# Patient Record
Sex: Female | Born: 1965 | Hispanic: No | Marital: Single | Smoking: Current every day smoker
Health system: Southern US, Community
[De-identification: ages and names within clinical notes are randomized; demographics above are authoritative.]

## PROBLEM LIST (undated history)

## (undated) DIAGNOSIS — E782 Mixed hyperlipidemia: Secondary | ICD-10-CM

## (undated) DIAGNOSIS — L309 Dermatitis, unspecified: Secondary | ICD-10-CM

## (undated) DIAGNOSIS — F32A Depression, unspecified: Secondary | ICD-10-CM

## (undated) DIAGNOSIS — F419 Anxiety disorder, unspecified: Secondary | ICD-10-CM

## (undated) DIAGNOSIS — M1990 Unspecified osteoarthritis, unspecified site: Secondary | ICD-10-CM

## (undated) HISTORY — DX: Depression, unspecified: F32.A

## (undated) HISTORY — DX: Anxiety disorder, unspecified: F41.9

## (undated) HISTORY — DX: Unspecified osteoarthritis, unspecified site: M19.90

## (undated) HISTORY — DX: Mixed hyperlipidemia: E78.2

## (undated) HISTORY — DX: Dermatitis, unspecified: L30.9

---

## 2002-09-04 ENCOUNTER — Other Ambulatory Visit: Admission: RE | Admit: 2002-09-04 | Discharge: 2002-09-04 | Payer: Self-pay | Admitting: Unknown Physician Specialty

## 2013-01-31 ENCOUNTER — Ambulatory Visit (INDEPENDENT_AMBULATORY_CARE_PROVIDER_SITE_OTHER): Payer: BC Managed Care – PPO | Admitting: Family Medicine

## 2013-01-31 ENCOUNTER — Encounter: Payer: Self-pay | Admitting: Family Medicine

## 2013-01-31 ENCOUNTER — Encounter (INDEPENDENT_AMBULATORY_CARE_PROVIDER_SITE_OTHER): Payer: Self-pay

## 2013-01-31 VITALS — BP 102/62 | HR 83 | Temp 98.3°F | Ht 62.0 in | Wt 166.0 lb

## 2013-01-31 DIAGNOSIS — Z716 Tobacco abuse counseling: Secondary | ICD-10-CM

## 2013-01-31 DIAGNOSIS — Z7189 Other specified counseling: Secondary | ICD-10-CM

## 2013-01-31 DIAGNOSIS — J069 Acute upper respiratory infection, unspecified: Secondary | ICD-10-CM

## 2013-01-31 DIAGNOSIS — F17200 Nicotine dependence, unspecified, uncomplicated: Secondary | ICD-10-CM

## 2013-01-31 DIAGNOSIS — J40 Bronchitis, not specified as acute or chronic: Secondary | ICD-10-CM

## 2013-01-31 DIAGNOSIS — R062 Wheezing: Secondary | ICD-10-CM

## 2013-01-31 MED ORDER — METHYLPREDNISOLONE ACETATE 40 MG/ML IJ SUSP: 80.0000 mg | Freq: Once | INTRAMUSCULAR | Status: DC

## 2013-01-31 MED ORDER — BENZONATATE 100 MG PO CAPS: 100.0000 mg | ORAL_CAPSULE | Freq: Three times a day (TID) | ORAL | Status: DC | PRN

## 2013-01-31 MED ORDER — METHYLPREDNISOLONE ACETATE 80 MG/ML IJ SUSP
80.0000 mg | Freq: Once | INTRAMUSCULAR | Status: AC
  Administered 2013-01-31: 80 mg via INTRAMUSCULAR

## 2013-01-31 MED ORDER — AZITHROMYCIN 250 MG PO TABS: ORAL_TABLET | ORAL | Status: DC

## 2013-01-31 NOTE — Patient Instructions (Signed)
Smoking Cessation Quitting smoking is important to your health and has many advantages. However, it is not always easy to quit since nicotine is a very addictive drug. Often times, people try 3 times or more before being able to quit. This document explains the best ways for you to prepare to quit smoking. Quitting takes hard work and a lot of effort, but you can do it. ADVANTAGES OF QUITTING SMOKING  You will live longer, feel better, and live better.  Your body will feel the impact of quitting smoking almost immediately.  Within 20 minutes, blood pressure decreases. Your pulse returns to its normal level.  After 8 hours, carbon monoxide levels in the blood return to normal. Your oxygen level increases.  After 24 hours, the chance of having a heart attack starts to decrease. Your breath, hair, and body stop smelling like smoke.  After 48 hours, damaged nerve endings begin to recover. Your sense of taste and smell improve.  After 72 hours, the body is virtually free of nicotine. Your bronchial tubes relax and breathing becomes easier.  After 2 to 12 weeks, lungs can hold more air. Exercise becomes easier and circulation improves.  The risk of having a heart attack, stroke, cancer, or lung disease is greatly reduced.  After 1 year, the risk of coronary heart disease is cut in half.  After 5 years, the risk of stroke falls to the same as a nonsmoker.  After 10 years, the risk of lung cancer is cut in half and the risk of other cancers decreases significantly.  After 15 years, the risk of coronary heart disease drops, usually to the level of a nonsmoker.  If you are pregnant, quitting smoking will improve your chances of having a healthy baby.  The people you live with, especially any children, will be healthier.  You will have extra money to spend on things other than cigarettes. QUESTIONS TO THINK ABOUT BEFORE ATTEMPTING TO QUIT You may want to talk about your answers with your  caregiver.  Why do you want to quit?  If you tried to quit in the past, what helped and what did not?  What will be the most difficult situations for you after you quit? How will you plan to handle them?  Who can help you through the tough times? Your family? Friends? A caregiver?  What pleasures do you get from smoking? What ways can you still get pleasure if you quit? Here are some questions to ask your caregiver:  How can you help me to be successful at quitting?  What medicine do you think would be best for me and how should I take it?  What should I do if I need more help?  What is smoking withdrawal like? How can I get information on withdrawal? GET READY  Set a quit date.  Change your environment by getting rid of all cigarettes, ashtrays, matches, and lighters in your home, car, or work. Do not let people smoke in your home.  Review your past attempts to quit. Think about what worked and what did not. GET SUPPORT AND ENCOURAGEMENT You have a better chance of being successful if you have help. You can get support in many ways.  Tell your family, friends, and co-workers that you are going to quit and need their support. Ask them not to smoke around you.  Get individual, group, or telephone counseling and support. Programs are available at local hospitals and health centers. Call your local health department for   information about programs in your area.  Spiritual beliefs and practices may help some smokers quit.  Download a "quit meter" on your computer to keep track of quit statistics, such as how long you have gone without smoking, cigarettes not smoked, and money saved.  Get a self-help book about quitting smoking and staying off of tobacco. LEARN NEW SKILLS AND BEHAVIORS  Distract yourself from urges to smoke. Talk to someone, go for a walk, or occupy your time with a task.  Change your normal routine. Take a different route to work. Drink tea instead of coffee.  Eat breakfast in a different place.  Reduce your stress. Take a hot bath, exercise, or read a book.  Plan something enjoyable to do every day. Reward yourself for not smoking.  Explore interactive web-based programs that specialize in helping you quit. GET MEDICINE AND USE IT CORRECTLY Medicines can help you stop smoking and decrease the urge to smoke. Combining medicine with the above behavioral methods and support can greatly increase your chances of successfully quitting smoking.  Nicotine replacement therapy helps deliver nicotine to your body without the negative effects and risks of smoking. Nicotine replacement therapy includes nicotine gum, lozenges, inhalers, nasal sprays, and skin patches. Some may be available over-the-counter and others require a prescription.  Antidepressant medicine helps people abstain from smoking, but how this works is unknown. This medicine is available by prescription.  Nicotinic receptor partial agonist medicine simulates the effect of nicotine in your brain. This medicine is available by prescription. Ask your caregiver for advice about which medicines to use and how to use them based on your health history. Your caregiver will tell you what side effects to look out for if you choose to be on a medicine or therapy. Carefully read the information on the package. Do not use any other product containing nicotine while using a nicotine replacement product.  RELAPSE OR DIFFICULT SITUATIONS Most relapses occur within the first 3 months after quitting. Do not be discouraged if you start smoking again. Remember, most people try several times before finally quitting. You may have symptoms of withdrawal because your body is used to nicotine. You may crave cigarettes, be irritable, feel very hungry, cough often, get headaches, or have difficulty concentrating. The withdrawal symptoms are only temporary. They are strongest when you first quit, but they will go away within  10 14 days. To reduce the chances of relapse, try to:  Avoid drinking alcohol. Drinking lowers your chances of successfully quitting.  Reduce the amount of caffeine you consume. Once you quit smoking, the amount of caffeine in your body increases and can give you symptoms, such as a rapid heartbeat, sweating, and anxiety.  Avoid smokers because they can make you want to smoke.  Do not let weight gain distract you. Many smokers will gain weight when they quit, usually less than 10 pounds. Eat a healthy diet and stay active. You can always lose the weight gained after you quit.  Find ways to improve your mood other than smoking. FOR MORE INFORMATION  www.smokefree.gov  Document Released: 01/24/2001 Document Revised: 08/01/2011 Document Reviewed: 05/11/2011 [employer] Patient Information 2014 [employer].  

## 2013-01-31 NOTE — Progress Notes (Signed)
   Subjective:    Patient ID: Tamara Mendoza, female    DOB: 09/05/1965, 47 y.o.   MRN: [id number]  HPI URI Symptoms Onset: 2 weeks  Description: cough, wheezing, post nasal drip  Modifying factors:  1/3-1/2 PPD smoker   Symptoms Nasal discharge: yes Fever: no Sore throat: no Cough: yes Wheezing: yes Ear pain: no GI symptoms: no Sick contacts: no  Red Flags  Stiff neck: no Dyspnea: no Rash: no Swallowing difficulty: no  Sinusitis Risk Factors Headache/face pain: no Double sickening: no tooth pain: no  Allergy Risk Factors Sneezing: no Itchy scratchy throat: no Seasonal symptoms: no  Flu Risk Factors Headache: no muscle aches: no severe fatigue: no     Review of Systems  All other systems reviewed and are negative.       Objective:   Physical Exam  Constitutional: She is oriented to person, place, and time. She appears well-developed and well-nourished.  HENT:  Head: Normocephalic and atraumatic.  Right Ear: External ear normal.  Left Ear: External ear normal.  +nasal erythema, rhinorrhea bilaterally, + post oropharyngeal erythema    Eyes: Conjunctivae are normal. Pupils are equal, round, and reactive to light.  Neck: Normal range of motion. Neck supple.  Cardiovascular: Normal rate and regular rhythm.   Pulmonary/Chest: Effort normal. She has wheezes.  Abdominal: Soft.  Musculoskeletal: Normal range of motion.  Neurological: She is alert and oriented to person, place, and time.  Skin: Skin is warm.          Assessment & Plan:  Bronchitis - Plan: methylPREDNISolone acetate (DEPO-MEDROL) injection 80 mg, azithromycin (ZITHROMAX) 250 MG tablet, benzonatate (TESSALON) 100 MG capsule  Wheezing - Plan: methylPREDNISolone acetate (DEPO-MEDROL) injection 80 mg  URI (upper respiratory infection)  Tobacco abuse counseling  No orders of the defined types were placed in this encounter.   Depomedrol 80mg  IM x 1 for wheezing Zpak for atypical  coverage.  Discussed smoking cessation at length.  Discussed supportive care and general/resp red flags.  Follow up as needed.

## 2013-08-19 ENCOUNTER — Ambulatory Visit (INDEPENDENT_AMBULATORY_CARE_PROVIDER_SITE_OTHER): Payer: BC Managed Care – PPO | Admitting: Family Medicine

## 2013-08-19 ENCOUNTER — Encounter: Payer: Self-pay | Admitting: Family Medicine

## 2013-08-19 VITALS — BP 103/65 | HR 85 | Temp 98.1°F | Ht 62.0 in | Wt 170.0 lb

## 2013-08-19 DIAGNOSIS — H65192 Other acute nonsuppurative otitis media, left ear: Secondary | ICD-10-CM

## 2013-08-19 DIAGNOSIS — H65199 Other acute nonsuppurative otitis media, unspecified ear: Secondary | ICD-10-CM

## 2013-08-19 DIAGNOSIS — J208 Acute bronchitis due to other specified organisms: Secondary | ICD-10-CM

## 2013-08-19 DIAGNOSIS — J209 Acute bronchitis, unspecified: Secondary | ICD-10-CM

## 2013-08-19 MED ORDER — HYDROCODONE-HOMATROPINE 5-1.5 MG/5ML PO SYRP: 5.0000 mL | ORAL_SOLUTION | Freq: Three times a day (TID) | ORAL | Status: DC | PRN

## 2013-08-19 MED ORDER — AMOXICILLIN 875 MG PO TABS: 875.0000 mg | ORAL_TABLET | Freq: Two times a day (BID) | ORAL | Status: DC

## 2013-08-19 MED ORDER — METHYLPREDNISOLONE ACETATE 80 MG/ML IJ SUSP
80.0000 mg | Freq: Once | INTRAMUSCULAR | Status: AC
  Administered 2013-08-19: 80 mg via INTRAMUSCULAR

## 2013-08-19 MED ORDER — FLUCONAZOLE 150 MG PO TABS: 150.0000 mg | ORAL_TABLET | Freq: Once | ORAL | Status: DC

## 2013-08-19 NOTE — Progress Notes (Signed)
   Subjective:    Patient ID: Janace Litten, female    DOB: 1965-04-29, 48 y.o.   MRN: [id number]  HPI This 48 y.o. female presents for evaluation of URI and sinus symptoms.  She c/o cough And she c/o left ear pain and discomfort for over a week.   Review of Systems C/o sinus drainage, cough, and left ear discomfort No chest pain, SOB, HA, dizziness, vision change, N/V, diarrhea, constipation, dysuria, urinary urgency or frequency, myalgias, arthralgias or rash.     Objective:   Physical Exam  Vital signs noted  Well developed well nourished female.  HEENT - Head atraumatic Normocephalic                Eyes - PERRLA, Conjuctiva - clear Sclera- Clear EOMI                Ears - EAC's Wnl TM  Gross Hearing WNL                Nose - Nares patent                 Throat - oropharanx wnl Respiratory - Lungs CTA bilateral Cardiac - RRR S1 and S2 without murmur GI - Abdomen soft Nontender and bowel sounds active x 4 Extremities - No edema. Neuro - Grossly intact.      Assessment & Plan:  Other acute nonsuppurative otitis media of left ear - Plan: methylPREDNISolone acetate (DEPO-MEDROL) injection 80 mg, amoxicillin (AMOXIL) 875 MG tablet, HYDROcodone-homatropine (HYCODAN) 5-1.5 MG/5ML syrup, fluconazole (DIFLUCAN) 150 MG tablet  Acute bronchitis due to other specified organisms - Plan: methylPREDNISolone acetate (DEPO-MEDROL) injection 80 mg, amoxicillin (AMOXIL) 875 MG tablet, HYDROcodone-homatropine (HYCODAN) 5-1.5 MG/5ML syrup, fluconazole (DIFLUCAN) 150 MG tablet  Push po fluids, rest, tylenol and motrin otc prn as directed for fever, arthralgias, and myalgias.  Follow up prn if sx's continue or persist.  Lysbeth Penner FNP

## 2014-02-28 ENCOUNTER — Ambulatory Visit (INDEPENDENT_AMBULATORY_CARE_PROVIDER_SITE_OTHER): Payer: BLUE CROSS/BLUE SHIELD | Admitting: Nurse Practitioner

## 2014-02-28 VITALS — BP 115/64 | HR 78 | Temp 98.6°F | Ht 62.0 in | Wt 169.6 lb

## 2014-02-28 DIAGNOSIS — H109 Unspecified conjunctivitis: Secondary | ICD-10-CM

## 2014-02-28 MED ORDER — NEOMYCIN-POLYMYXIN-HC 3.5-10000-1 OP SUSP: 3.0000 [drp] | Freq: Four times a day (QID) | OPHTHALMIC | Status: DC

## 2014-02-28 NOTE — Patient Instructions (Signed)

## 2014-02-28 NOTE — Progress Notes (Signed)
   Subjective:    Patient ID: Tamara Mendoza, female    DOB: [birth year], 49 y.o.   MRN: [id number]  HPI Patient in today with red eye- was matted together this morning- feels kinda like it is burning.    Review of Systems  Constitutional: Negative.   HENT: Negative.   Eyes: Positive for pain, discharge, redness and itching. Negative for photophobia and visual disturbance.  Respiratory: Negative.   Cardiovascular: Negative.   Genitourinary: Negative.   Neurological: Negative.   Psychiatric/Behavioral: Negative.   All other systems reviewed and are negative.      Objective:   Physical Exam  Constitutional: She is oriented to person, place, and time. She appears well-developed and well-nourished.  HENT:  Right Ear: External ear normal.  Left Ear: External ear normal.  Nose: Nose normal.  Mouth/Throat: Oropharynx is clear and moist.  Eyes: Pupils are equal, round, and reactive to light. Right eye exhibits discharge. Left eye exhibits no discharge.  Conjunctival erythema  Cardiovascular: Normal rate, regular rhythm and normal heart sounds.   Pulmonary/Chest: Effort normal and breath sounds normal.  Neurological: She is alert and oriented to person, place, and time.  Skin: Skin is warm and dry.  Psychiatric: She has a normal mood and affect. Her behavior is normal. Judgment and thought content normal.    BP 115/64 mmHg  Pulse 78  Temp(Src) 98.6 F (37 C) (Oral)  Ht 5\' 2"  (1.575 m)  Wt 169 lb 9.6 oz (76.93 kg)  BMI 31.01 kg/m2       Assessment & Plan:   1. Conjunctivitis of right eye    Good handwashing Cool compresses RTO prn  Meds ordered this encounter  Medications  . neomycin-polymyxin-hydrocortisone (CORTISPORIN) 3.5-10000-1 ophthalmic suspension    Sig: Place 3 drops into both eyes 4 (four) times daily.    Dispense:  7.5 mL    Refill:  0    Order Specific Question:  Supervising Provider    Answer:  Chipper Herb [[id number]]   Mary-Margaret Hassell Done, FNP

## 2015-09-03 ENCOUNTER — Encounter: Payer: Self-pay | Admitting: Family Medicine

## 2016-02-09 ENCOUNTER — Encounter: Payer: BLUE CROSS/BLUE SHIELD | Admitting: *Deleted

## 2016-03-30 ENCOUNTER — Encounter: Payer: Self-pay | Admitting: Family Medicine

## 2016-08-09 ENCOUNTER — Encounter: Payer: BLUE CROSS/BLUE SHIELD | Admitting: *Deleted

## 2019-10-02 ENCOUNTER — Ambulatory Visit (INDEPENDENT_AMBULATORY_CARE_PROVIDER_SITE_OTHER): Payer: BC Managed Care – PPO | Admitting: Nurse Practitioner

## 2019-10-02 ENCOUNTER — Encounter: Payer: Self-pay | Admitting: Nurse Practitioner

## 2019-10-02 ENCOUNTER — Ambulatory Visit (INDEPENDENT_AMBULATORY_CARE_PROVIDER_SITE_OTHER): Payer: BC Managed Care – PPO

## 2019-10-02 ENCOUNTER — Other Ambulatory Visit: Payer: Self-pay

## 2019-10-02 VITALS — BP 112/74 | HR 81 | Temp 97.8°F | Ht 62.0 in | Wt 188.4 lb

## 2019-10-02 DIAGNOSIS — F329 Major depressive disorder, single episode, unspecified: Secondary | ICD-10-CM

## 2019-10-02 DIAGNOSIS — F32A Depression, unspecified: Secondary | ICD-10-CM | POA: Insufficient documentation

## 2019-10-02 DIAGNOSIS — F419 Anxiety disorder, unspecified: Secondary | ICD-10-CM | POA: Diagnosis not present

## 2019-10-02 DIAGNOSIS — M25562 Pain in left knee: Secondary | ICD-10-CM

## 2019-10-02 MED ORDER — IBUPROFEN 600 MG PO TABS: 600.0000 mg | ORAL_TABLET | Freq: Three times a day (TID) | ORAL | 0 refills | Status: DC | PRN

## 2019-10-02 MED ORDER — PREDNISONE 10 MG (21) PO TBPK: ORAL_TABLET | ORAL | 0 refills | Status: DC

## 2019-10-02 MED ORDER — ESCITALOPRAM OXALATE 5 MG PO TABS: 5.0000 mg | ORAL_TABLET | Freq: Every day | ORAL | 0 refills | Status: DC

## 2019-10-02 NOTE — Addendum Note (Signed)
Addended by: Ivy Lynn on: 10/02/2019 04:43 PM   Modules accepted: Level of Service

## 2019-10-02 NOTE — Progress Notes (Addendum)
New Patient Office Visit  Subjective:  Patient ID: Tamara Mendoza, female    DOB: 09-18-1965  Age: 54 y.o. MRN: [id number]  CC:  Chief Complaint  Patient presents with  . New Patient (Initial Visit)  . Establish Care    Previous [facility] patient   . Knee Pain    Patient states she has been having left knee pain for a few weeks.    HPI Cordell A Ruesch presents for Knee Pain  Incident onset: few months ago. The incident occurred at work. There was no injury mechanism. The pain is present in the left leg, left knee and left thigh. The quality of the pain is described as aching. The pain is at a severity of 5/10. The pain is moderate. The pain has been constant since onset. She reports no foreign bodies present. The symptoms are aggravated by movement. Treatments tried: OTC topical pain medication. The treatment provided mild relief.    Depression with anxiety: patient complains of depression and anxiety. She complains of increased stressors from home, excessive worrying, insomnia.  This has been ongoing for a few months.  Patient has never been treated for anxiety or depression in the past.   She denies current suicidal and homicidal plan or intent.  Family history is not significant for anxiety or depression or psychiatric illness.  Possible organic causes contributing are: none.  Risk factors: negative life event son dealing with drugs, working long hours in [employer] environment .     GAD 7 : Generalized Anxiety Score 10/02/2019  Nervous, Anxious, on Edge 2  Control/stop worrying 3  Worry too much - different things 3  Trouble relaxing 1  Restless 2  Easily annoyed or irritable 2  Afraid - awful might happen 2  Total GAD 7 Score 15      Office Visit from 10/02/2019 in [facility]  PHQ-9 Total Score 10     Past Surgical History:  Procedure Laterality Date  . CESAREAN SECTION      Family History  Problem Relation Age of Onset  . Cancer Mother        uterus    . Hypertension Mother   . Cancer Father   . Diabetes Sister   . Diabetes Brother   . Diabetes Sister     Social History   Socioeconomic History  . Marital status: Single    Spouse name: Not on file  . Number of children: Not on file  . Years of education: Not on file  . Highest education level: Not on file  Occupational History  . Not on file  Tobacco Use  . Smoking status: Current Every Day Smoker    Packs/day: 0.25    Years: 30.00    Pack years: 7.50    Types: Cigarettes  . Smokeless tobacco: Never Used  Vaping Use  . Vaping Use: Never used  Substance and Sexual Activity  . Alcohol use: Yes    Comment: occ  . Drug use: No  . Sexual activity: Not on file  Other Topics Concern  . Not on file  Social History Narrative  . Not on file   Social Determinants of Health   Financial Resource Strain:   . Difficulty of Paying Living Expenses: Not on file  Food Insecurity:   . Worried About Charity fundraiser in the Last Year: Not on file  . Ran Out of Food in the Last Year: Not on file  Transportation Needs:   .  Lack of Transportation (Medical): Not on file  . Lack of Transportation (Non-Medical): Not on file  Physical Activity:   . Days of Exercise per Week: Not on file  . Minutes of Exercise per Session: Not on file  Stress:   . Feeling of Stress : Not on file  Social Connections:   . Frequency of Communication with Friends and Family: Not on file  . Frequency of Social Gatherings with Friends and Family: Not on file  . Attends Religious Services: Not on file  . Active Member of Clubs or Organizations: Not on file  . Attends Archivist Meetings: Not on file  . Marital Status: Not on file  Intimate Partner Violence:   . Fear of Current or Ex-Partner: Not on file  . Emotionally Abused: Not on file  . Physically Abused: Not on file  . Sexually Abused: Not on file    ROS Review of Systems  Constitutional: Negative.   HENT: Negative.   Eyes:  Negative.   Respiratory: Negative.   Cardiovascular: Negative.   Gastrointestinal: Negative.   Genitourinary: Negative.   Musculoskeletal: Positive for myalgias.  Skin: Negative.   Psychiatric/Behavioral: Negative for self-injury and suicidal ideas. The patient is nervous/anxious.     Objective:   Today's Vitals: BP 112/74   Pulse 81   Temp 97.8 F (36.6 C) (Temporal)   Ht 5\' 2"  (1.575 m)   Wt 188 lb 6.4 oz (85.5 kg)   SpO2 94%   BMI 34.46 kg/m   Physical Exam Vitals reviewed.  Constitutional:      Appearance: Normal appearance.  HENT:     Head: Normocephalic.  Eyes:     Conjunctiva/sclera: Conjunctivae normal.  Cardiovascular:     Rate and Rhythm: Normal rate and regular rhythm.     Pulses: Normal pulses.     Heart sounds: Normal heart sounds.  Pulmonary:     Effort: Pulmonary effort is normal.     Breath sounds: Normal breath sounds.  Abdominal:     General: Bowel sounds are normal.  Musculoskeletal:        General: Tenderness present.     Cervical back: Normal range of motion.  Skin:    General: Skin is warm.  Neurological:     Mental Status: She is alert and oriented to person, place, and time.     Assessment & Plan:  Acute pain of left knee Patient is a 54 year old female who presents to clinic for acute pain of her left knee.  Onset a few months ago, patient attributes knee pain to working long hours on concrete floors for 16 years.  The pain is 5 out of 10 on the pain scale of 0-10.  She describes the pain as aching and moderate.  Patient uses over-the-counter topical cream to alleviate pain which has not really helped much. Started patient on ibuprofen 600 mg, prednisone pack, advised ice application, elevating knee and x-ray. Provided education with printed handouts given to patient. Patient follow-up with worsening or unresolved symptoms. Orthopedic referral may be necessary in the future.  Anxiety and depression Patient complains of depression  anxiety.  She complains of increased stressors from home, excessive worry and insomnia.  This has been ongoing for few months.  Patient has never been treated for anxiety or depression in the past.  She denies current suicidal and homicidal plan or intent.  Family history not significant for anxiety or depression or psychiatric illness.  No possible organic causes contributing to her symptoms.  Provided education to patient with printed handouts given Patient started on Lexapro 5 mg daily.  Rx sent to pharmacy. Follow-up in 4 weeks.  Problem List Items Addressed This Visit      Other   Acute pain of left knee - Primary   Relevant Medications   predniSONE (STERAPRED UNI-PAK 21 TAB) 10 MG (21) TBPK tablet   ibuprofen (ADVIL) 600 MG tablet   Other Relevant Orders   DG Knee 1-2 Views Left   Anxiety and depression   Relevant Medications   escitalopram (LEXAPRO) 5 MG tablet      Outpatient Encounter Medications as of 10/02/2019  Medication Sig  . [DISCONTINUED] neomycin-polymyxin-hydrocortisone (CORTISPORIN) 3.5-10000-1 ophthalmic suspension Place 3 drops into both eyes 4 (four) times daily.   No facility-administered encounter medications on file as of 10/02/2019.    Follow-up: Return in about 4 weeks (around 10/30/2019).   Ivy Lynn, NP

## 2019-10-02 NOTE — Assessment & Plan Note (Signed)
Patient is a 54 year old female who presents to clinic for acute pain of her left knee.  Onset a few months ago, patient attributes knee pain to working long hours on concrete floors for 16 years.  The pain is 5 out of 10 on the pain scale of 0-10.  She describes the pain as aching and moderate.  Patient uses over-the-counter topical cream to alleviate pain which has not really helped much. Started patient on ibuprofen 600 mg, prednisone pack, advised ice application, elevating knee and x-ray. Provided education with printed handouts given to patient. Patient follow-up with worsening or unresolved symptoms. Orthopedic referral may be necessary in the future.

## 2019-10-02 NOTE — Patient Instructions (Signed)
Generalized Anxiety Disorder, Adult Generalized anxiety disorder (GAD) is a mental health disorder. People with this condition constantly worry about everyday events. Unlike normal anxiety, worry related to GAD is not triggered by a specific event. These worries also do not fade or get better with time. GAD interferes with life functions, including relationships, work, and school. GAD can vary from mild to severe. People with severe GAD can have intense waves of anxiety with physical symptoms (panic attacks). What are the causes? The exact cause of GAD is not known. What increases the risk? This condition is more likely to develop in:  Women.  People who have a family history of anxiety disorders.  People who are very shy.  People who experience very stressful life events, such as the death of a loved one.  People who have a very stressful family environment. What are the signs or symptoms? People with GAD often worry excessively about many things in their lives, such as their health and family. They may also be overly concerned about:  Doing well at work.  Being on time.  Natural disasters.  Friendships. Physical symptoms of GAD include:  Fatigue.  Muscle tension or having muscle twitches.  Trembling or feeling shaky.  Being easily startled.  Feeling like your heart is pounding or racing.  Feeling out of breath or like you cannot take a deep breath.  Having trouble falling asleep or staying asleep.  Sweating.  Nausea, diarrhea, or irritable bowel syndrome (IBS).  Headaches.  Trouble concentrating or remembering facts.  Restlessness.  Irritability. How is this diagnosed? Your health care provider can diagnose GAD based on your symptoms and medical history. You will also have a physical exam. The health care provider will ask specific questions about your symptoms, including how severe they are, when they started, and if they come and go. Your health care  provider may ask you about your use of alcohol or drugs, including prescription medicines. Your health care provider may refer you to a mental health specialist for further evaluation. Your health care provider will do a thorough examination and may perform additional tests to rule out other possible causes of your symptoms. To be diagnosed with GAD, a person must have anxiety that:  Is out of his or her control.  Affects several different aspects of his or her life, such as work and relationships.  Causes distress that makes him or her unable to take part in normal activities.  Includes at least three physical symptoms of GAD, such as restlessness, fatigue, trouble concentrating, irritability, muscle tension, or sleep problems. Before your health care provider can confirm a diagnosis of GAD, these symptoms must be present more days than they are not, and they must last for six months or longer. How is this treated? The following therapies are usually used to treat GAD:  Medicine. Antidepressant medicine is usually prescribed for long-term daily control. Antianxiety medicines may be added in severe cases, especially when panic attacks occur.  Talk therapy (psychotherapy). Certain types of talk therapy can be helpful in treating GAD by providing support, education, and guidance. Options include: ? Cognitive behavioral therapy (CBT). People learn coping skills and techniques to ease their anxiety. They learn to identify unrealistic or negative thoughts and behaviors and to replace them with positive ones. ? Acceptance and commitment therapy (ACT). This treatment teaches people how to be mindful as a way to cope with unwanted thoughts and feelings. ? Biofeedback. This process trains you to manage your body's response (  physiological response) through breathing techniques and relaxation methods. You will work with a therapist while machines are used to monitor your physical symptoms.  Stress  management techniques. These include yoga, meditation, and exercise. A mental health specialist can help determine which treatment is best for you. Some people see improvement with one type of therapy. However, other people require a combination of therapies. Follow these instructions at home:  Take over-the-counter and prescription medicines only as told by your health care provider.  Try to maintain a normal routine.  Try to anticipate stressful situations and allow extra time to manage them.  Practice any stress management or self-calming techniques as taught by your health care provider.  Do not punish yourself for setbacks or for not making progress.  Try to recognize your accomplishments, even if they are small.  Keep all follow-up visits as told by your health care provider. This is important. Contact a health care provider if:  Your symptoms do not get better.  Your symptoms get worse.  You have signs of depression, such as: ? A persistently sad, cranky, or irritable mood. ? Loss of enjoyment in activities that used to bring you joy. ? Change in weight or eating. ? Changes in sleeping habits. ? Avoiding friends or family members. ? Loss of energy for normal tasks. ? Feelings of guilt or worthlessness. Get help right away if:  You have serious thoughts about hurting yourself or others. If you ever feel like you may hurt yourself or others, or have thoughts about taking your own life, get help right away. You can go to your nearest emergency department or call:  Your local emergency services (911 in the [place]  A suicide crisis helpline, such as the [facility] at (804)020-0302. This is open 24 hours a day. Summary  Generalized anxiety disorder (GAD) is a mental health disorder that involves worry that is not triggered by a specific event.  People with GAD often worry excessively about many things in their lives, such as their health and  family.  GAD may cause physical symptoms such as restlessness, trouble concentrating, sleep problems, frequent sweating, nausea, diarrhea, headaches, and trembling or muscle twitching.  A mental health specialist can help determine which treatment is best for you. Some people see improvement with one type of therapy. However, other people require a combination of therapies. This information is not intended to replace advice given to you by your health care provider. Make sure you discuss any questions you have with your health care provider. Document Revised: 01/12/2017 Document Reviewed: 12/21/2015 Elsevier Patient Education  2020 [place] With Depression Everyone experiences occasional disappointment, sadness, and loss in their lives. When you are feeling down, blue, or sad for at least 2 weeks in a row, it may mean that you have depression. Depression can affect your thoughts and feelings, relationships, daily activities, and physical health. It is caused by changes in the way your brain functions. If you receive a diagnosis of depression, your health care provider will tell you which type of depression you have and what treatment options are available to you. If you are living with depression, there are ways to help you recover from it and also ways to prevent it from coming back. How to cope with lifestyle changes Coping with stress     Stress is your body's reaction to life changes and events, both good and bad. Stressful situations may include:  Getting married.  The death of a spouse.  Losing a job.  Retiring.  Having a baby. Stress can last just a few hours or it can be ongoing. Stress can play a major role in depression, so it is important to learn both how to cope with stress and how to think about it differently. Talk with your health care provider or a counselor if you would like to learn more about stress reduction. He or she may suggest some stress reduction  techniques, such as:  Music therapy. This can include creating music or listening to music. Choose music that you enjoy and that inspires you.  Mindfulness-based meditation. This kind of meditation can be done while sitting or walking. It involves being aware of your normal breaths, rather than trying to control your breathing.  Centering prayer. This is a kind of meditation that involves focusing on a spiritual word or phrase. Choose a word, phrase, or sacred image that is meaningful to you and that brings you peace.  Deep breathing. To do this, expand your stomach and inhale slowly through your nose. Hold your breath for 3-5 seconds, then exhale slowly, allowing your stomach muscles to relax.  Muscle relaxation. This involves intentionally tensing muscles then relaxing them. Choose a stress reduction technique that fits your lifestyle and personality. Stress reduction techniques take time and practice to develop. Set aside 5-15 minutes a day to do them. Therapists can offer training in these techniques. The training may be covered by some insurance plans. Other things you can do to manage stress include:  Keeping a stress diary. This can help you learn what triggers your stress and ways to control your response.  Understanding what your limits are and saying no to requests or events that lead to a schedule that is too full.  Thinking about how you respond to certain situations. You may not be able to control everything, but you can control how you react.  Adding humor to your life by watching funny films or TV shows.  Making time for activities that help you relax and not feeling guilty about spending your time this way.  Medicines Your health care provider may suggest certain medicines if he or she feels that they will help improve your condition. Avoid using alcohol and other substances that may prevent your medicines from working properly (may interact). It is also important to:  Talk  with your pharmacist or health care provider about all the medicines that you take, their possible side effects, and what medicines are safe to take together.  Make it your goal to take part in all treatment decisions (shared decision-making). This includes giving input on the side effects of medicines. It is best if shared decision-making with your health care provider is part of your total treatment plan. If your health care provider prescribes a medicine, you may not notice the full benefits of it for 4-8 weeks. Most people who are treated for depression need to be on medicine for at least 6-12 months after they feel better. If you are taking medicines as part of your treatment, do not stop taking medicines without first talking to your health care provider. You may need to have the medicine slowly decreased (tapered) over time to decrease the risk of harmful side effects. Relationships Your health care provider may suggest family therapy along with individual therapy and drug therapy. While there may not be family problems that are causing you to feel depressed, it is still important to make sure your family learns as much as they can  about your mental health. Having your family's support can help make your treatment successful. How to recognize changes in your condition Everyone has a different response to treatment for depression. Recovery from major depression happens when you have not had signs of major depression for two months. This may mean that you will start to:  Have more interest in doing activities.  Feel less hopeless than you did 2 months ago.  Have more energy.  Overeat less often, or have better or improving appetite.  Have better concentration. Your health care provider will work with you to decide the next steps in your recovery. It is also important to recognize when your condition is getting worse. Watch for these signs:  Having fatigue or low energy.  Eating too much or  too little.  Sleeping too much or too little.  Feeling restless, agitated, or hopeless.  Having trouble concentrating or making decisions.  Having unexplained physical complaints.  Feeling irritable, angry, or aggressive. Get help as soon as you or your family members notice these symptoms coming back. How to get support and help from others How to talk with friends and family members about your condition  Talking to friends and family members about your condition can provide you with one way to get support and guidance. Reach out to trusted friends or family members, explain your symptoms to them, and let them know that you are working with a health care provider to treat your depression. Financial resources Not all insurance plans cover mental health care, so it is important to check with your insurance carrier. If paying for co-pays or counseling services is a problem, search for a local or county mental health care center. They may be able to offer public mental health care services at low or no cost when you are not able to see a private health care provider. If you are taking medicine for depression, you may be able to get the generic form, which may be less expensive. Some makers of prescription medicines also offer help to patients who cannot afford the medicines they need. Follow these instructions at home:   Get the right amount and quality of sleep.  Cut down on using caffeine, tobacco, alcohol, and other potentially harmful substances.  Try to exercise, such as walking or lifting small weights.  Take over-the-counter and prescription medicines only as told by your health care provider.  Eat a healthy diet that includes plenty of vegetables, fruits, whole grains, low-fat dairy products, and lean protein. Do not eat a lot of foods that are high in solid fats, added sugars, or salt.  Keep all follow-up visits as told by your health care provider. This is important. Contact a  health care provider if:  You stop taking your antidepressant medicines, and you have any of these symptoms: ? Nausea. ? Headache. ? Feeling lightheaded. ? Chills and body aches. ? Not being able to sleep (insomnia).  You or your friends and family think your depression is getting worse. Get help right away if:  You have thoughts of hurting yourself or others. If you ever feel like you may hurt yourself or others, or have thoughts about taking your own life, get help right away. You can go to your nearest emergency department or call:  Your local emergency services (911 in the [place]  A suicide crisis helpline, such as the [facility] at 437-865-2080. This is open 24-hours a day. Summary  If you are living with depression, there  are ways to help you recover from it and also ways to prevent it from coming back.  Work with your health care team to create a management plan that includes counseling, stress management techniques, and healthy lifestyle habits. This information is not intended to replace advice given to you by your health care provider. Make sure you discuss any questions you have with your health care provider. Document Revised: 05/24/2018 Document Reviewed: 01/03/2016 Elsevier Patient Education  [place]. Acute Knee Pain, Adult Many things can cause knee pain. Sometimes, knee pain is sudden (acute) and may be caused by damage, swelling, or irritation of the muscles and tissues that support your knee. The pain often goes away on its own with time and rest. If the pain does not go away, tests may be done to find out what is causing the pain. Follow these instructions at home: Pay attention to any changes in your symptoms. Take these actions to relieve your pain. If you have a knee sleeve or brace:   Wear the sleeve or brace as told by your doctor. Remove it only as told by your doctor.  Loosen the sleeve or brace if your  toes: ? Tingle. ? Become numb. ? Turn cold and blue.  Keep the sleeve or brace clean.  If the sleeve or brace is not waterproof: ? Do not let it get wet. ? Cover it with a watertight covering when you take a bath or shower. Activity  Rest your knee.  Do not do things that cause pain.  Avoid activities where both feet leave the ground at the same time (high-impact activities). Examples are running, jumping rope, and doing jumping jacks.  Work with a physical therapist to make a safe exercise program, as told by your doctor. Managing pain, stiffness, and swelling   If told, put ice on the knee: ? Put ice in a plastic bag. ? Place a towel between your skin and the bag. ? Leave the ice on for 20 minutes, 2-3 times a day.  If told, put pressure (compression) on your injured knee to control swelling, give support, and help with discomfort. Compression may be done with an elastic bandage. General instructions  Take all medicines only as told by your doctor.  Raise (elevate) your knee while you are sitting or lying down. Make sure your knee is higher than your heart.  Sleep with a pillow under your knee.  Do not use any products that contain nicotine or tobacco. These include cigarettes, e-cigarettes, and chewing tobacco. These products may slow down healing. If you need help quitting, ask your doctor.  If you are overweight, work with your doctor and a food expert (dietitian) to set goals to lose weight. Being overweight can make your knee hurt more.  Keep all follow-up visits as told by your doctor. This is important. Contact a doctor if:  The knee pain does not stop.  The knee pain changes or gets worse.  You have a fever along with knee pain.  Your knee feels warm when you touch it.  Your knee gives out or locks up. Get help right away if:  Your knee swells, and the swelling gets worse.  You cannot move your knee.  You have very bad knee pain. Summary  Many  things can cause knee pain. The pain often goes away on its own with time and rest.  Your doctor may do tests to find out the cause of the pain.  Pay attention to  any changes in your symptoms. Relieve your pain with rest, medicines, light activity, and use of ice.  Get help right away if you cannot move your knee or your knee pain is very bad. This information is not intended to replace advice given to you by your health care provider. Make sure you discuss any questions you have with your health care provider. Document Revised: 07/12/2017 Document Reviewed: 07/12/2017 Elsevier Patient Education  [place].

## 2019-10-02 NOTE — Assessment & Plan Note (Signed)
Patient complains of depression anxiety.  She complains of increased stressors from home, excessive worry and insomnia.  This has been ongoing for few months.  Patient has never been treated for anxiety or depression in the past.  She denies current suicidal and homicidal plan or intent.  Family history not significant for anxiety or depression or psychiatric illness.  No possible organic causes contributing to her symptoms.  Provided education to patient with printed handouts given Patient started on Lexapro 5 mg daily.  Rx sent to pharmacy. Follow-up in 4 weeks.

## 2019-10-30 ENCOUNTER — Ambulatory Visit: Payer: BC Managed Care – PPO | Admitting: Family Medicine

## 2019-11-13 ENCOUNTER — Ambulatory Visit (INDEPENDENT_AMBULATORY_CARE_PROVIDER_SITE_OTHER): Payer: BC Managed Care – PPO | Admitting: Family Medicine

## 2019-11-13 ENCOUNTER — Other Ambulatory Visit (HOSPITAL_COMMUNITY)
Admission: RE | Admit: 2019-11-13 | Discharge: 2019-11-13 | Disposition: A | Discharge status: Home / Self Care | Payer: BC Managed Care – PPO | Source: Ambulatory Visit | Attending: Family Medicine | Admitting: Family Medicine

## 2019-11-13 ENCOUNTER — Other Ambulatory Visit: Payer: Self-pay

## 2019-11-13 ENCOUNTER — Encounter: Payer: Self-pay | Admitting: Family Medicine

## 2019-11-13 VITALS — BP 117/77 | HR 71 | Temp 98.1°F | Ht 62.0 in | Wt 188.8 lb

## 2019-11-13 DIAGNOSIS — Z124 Encounter for screening for malignant neoplasm of cervix: Secondary | ICD-10-CM

## 2019-11-13 DIAGNOSIS — M25562 Pain in left knee: Secondary | ICD-10-CM | POA: Diagnosis not present

## 2019-11-13 DIAGNOSIS — Z0001 Encounter for general adult medical examination with abnormal findings: Secondary | ICD-10-CM | POA: Diagnosis not present

## 2019-11-13 DIAGNOSIS — R232 Flushing: Secondary | ICD-10-CM

## 2019-11-13 DIAGNOSIS — F329 Major depressive disorder, single episode, unspecified: Secondary | ICD-10-CM

## 2019-11-13 DIAGNOSIS — Z113 Encounter for screening for infections with a predominantly sexual mode of transmission: Secondary | ICD-10-CM

## 2019-11-13 DIAGNOSIS — Z1151 Encounter for screening for human papillomavirus (HPV): Secondary | ICD-10-CM

## 2019-11-13 DIAGNOSIS — L309 Dermatitis, unspecified: Secondary | ICD-10-CM

## 2019-11-13 DIAGNOSIS — G479 Sleep disorder, unspecified: Secondary | ICD-10-CM | POA: Diagnosis not present

## 2019-11-13 DIAGNOSIS — Z1211 Encounter for screening for malignant neoplasm of colon: Secondary | ICD-10-CM

## 2019-11-13 DIAGNOSIS — F32A Depression, unspecified: Secondary | ICD-10-CM

## 2019-11-13 DIAGNOSIS — F419 Anxiety disorder, unspecified: Secondary | ICD-10-CM | POA: Diagnosis not present

## 2019-11-13 DIAGNOSIS — Z720 Tobacco use: Secondary | ICD-10-CM

## 2019-11-13 DIAGNOSIS — G8929 Other chronic pain: Secondary | ICD-10-CM

## 2019-11-13 DIAGNOSIS — Z0000 Encounter for general adult medical examination without abnormal findings: Secondary | ICD-10-CM

## 2019-11-13 MED ORDER — TRIAMCINOLONE ACETONIDE 0.1 % EX CREA: 1.0000 "application " | TOPICAL_CREAM | Freq: Two times a day (BID) | CUTANEOUS | 2 refills | Status: DC

## 2019-11-13 MED ORDER — IBUPROFEN 600 MG PO TABS: 600.0000 mg | ORAL_TABLET | Freq: Three times a day (TID) | ORAL | 2 refills | Status: DC | PRN

## 2019-11-13 MED ORDER — ESCITALOPRAM OXALATE 10 MG PO TABS: 10.0000 mg | ORAL_TABLET | Freq: Every day | ORAL | 2 refills | Status: DC

## 2019-11-13 NOTE — Patient Instructions (Signed)
[place] or [employer] for hot flashes.  Voltaren gel for knee pain.   Preventive Care 36-55 Years Old, Female Preventive care refers to visits with your health care provider and lifestyle choices that can promote health and wellness. This includes:  A yearly physical exam. This may also be called an annual well check.  Regular dental visits and eye exams.  Immunizations.  Screening for certain conditions.  Healthy lifestyle choices, such as eating a healthy diet, getting regular exercise, not using drugs or products that contain nicotine and tobacco, and limiting alcohol use. What can I expect for my preventive care visit? Physical exam Your health care provider will check your:  Height and weight. This may be used to calculate body mass index (BMI), which tells if you are at a healthy weight.  Heart rate and blood pressure.  Skin for abnormal spots. Counseling Your health care provider may ask you questions about your:  Alcohol, tobacco, and drug use.  Emotional well-being.  Home and relationship well-being.  Sexual activity.  Eating habits.  Work and work Statistician.  Method of birth control.  Menstrual cycle.  Pregnancy history. What immunizations do I need?  Influenza (flu) vaccine  This is recommended every year. Tetanus, diphtheria, and pertussis (Tdap) vaccine  You may need a Td booster every 10 years. Varicella (chickenpox) vaccine  You may need this if you have not been vaccinated. Zoster (shingles) vaccine  You may need this after age 63. Measles, mumps, and rubella (MMR) vaccine  You may need at least one dose of MMR if you were born in 1957 or later. You may also need a second dose. Pneumococcal conjugate (PCV13) vaccine  You may need this if you have certain conditions and were not previously vaccinated. Pneumococcal polysaccharide (PPSV23) vaccine  You may need one or two doses if you smoke cigarettes or if you have certain  conditions. Meningococcal conjugate (MenACWY) vaccine  You may need this if you have certain conditions. Hepatitis A vaccine  You may need this if you have certain conditions or if you travel or work in places where you may be exposed to hepatitis A. Hepatitis B vaccine  You may need this if you have certain conditions or if you travel or work in places where you may be exposed to hepatitis B. Haemophilus influenzae type b (Hib) vaccine  You may need this if you have certain conditions. Human papillomavirus (HPV) vaccine  If recommended by your health care provider, you may need three doses over 6 months. You may receive vaccines as individual doses or as more than one vaccine together in one shot (combination vaccines). Talk with your health care provider about the risks and benefits of combination vaccines. What tests do I need? Blood tests  Lipid and cholesterol levels. These may be checked every 5 years, or more frequently if you are over 35 years old.  Hepatitis C test.  Hepatitis B test. Screening  Lung cancer screening. You may have this screening every year starting at age 30 if you have a 30-pack-year history of smoking and currently smoke or have quit within the past 15 years.  Colorectal cancer screening. All adults should have this screening starting at age 19 and continuing until age 107. Your health care provider may recommend screening at age 43 if you are at increased risk. You will have tests every 1-10 years, depending on your results and the type of screening test.  Diabetes screening. This is done by checking your blood  sugar (glucose) after you have not eaten for a while (fasting). You may have this done every 1-3 years.  Mammogram. This may be done every 1-2 years. Talk with your health care provider about when you should start having regular mammograms. This may depend on whether you have a family history of breast cancer.  BRCA-related cancer screening. This  may be done if you have a family history of breast, ovarian, tubal, or peritoneal cancers.  Pelvic exam and Pap test. This may be done every 3 years starting at age 28. Starting at age 54, this may be done every 5 years if you have a Pap test in combination with an HPV test. Other tests  Sexually transmitted disease (STD) testing.  Bone density scan. This is done to screen for osteoporosis. You may have this scan if you are at high risk for osteoporosis. Follow these instructions at home: Eating and drinking  Eat a diet that includes fresh fruits and vegetables, whole grains, lean protein, and low-fat dairy.  Take vitamin and mineral supplements as recommended by your health care provider.  Do not drink alcohol if: ? Your health care provider tells you not to drink. ? You are pregnant, may be pregnant, or are planning to become pregnant.  If you drink alcohol: ? Limit how much you have to 0-1 drink a day. ? Be aware of how much alcohol is in your drink. In the [place] one drink equals one 12 oz bottle of beer (355 mL), one 5 oz glass of wine (148 mL), or one 1 oz glass of hard liquor (44 mL). Lifestyle  Take daily care of your teeth and gums.  Stay active. Exercise for at least 30 minutes on 5 or more days each week.  Do not use any products that contain nicotine or tobacco, such as cigarettes, e-cigarettes, and chewing tobacco. If you need help quitting, ask your health care provider.  If you are sexually active, practice safe sex. Use a condom or other form of birth control (contraception) in order to prevent pregnancy and STIs (sexually transmitted infections).  If told by your health care provider, take low-dose aspirin daily starting at age 49. What's next?  Visit your health care provider once a year for a well check visit.  Ask your health care provider how often you should have your eyes and teeth checked.  Stay up to date on all vaccines. This information is not  intended to replace advice given to you by your health care provider. Make sure you discuss any questions you have with your health care provider. Document Revised: 10/11/2017 Document Reviewed: 10/11/2017 Elsevier Patient Education  2020 [employer].

## 2019-11-13 NOTE — Progress Notes (Signed)
Assessment & Plan:  1. Well adult exam - Preventive health education provided. Patient declined TDAP, influenza and COVID-19 vaccines, hepatitis C and HIV screening. She reports she had Shingrix last year at [place] - we will attempt to get these administration dates. Pap smear today. Referred for colonoscopy. Mammogram scheduled for the bus that comes to the office on 12/23/2019.  - CBC with Differential/Platelet - CMP14+EGFR - Lipid panel - TSH  2. Anxiety and depression - Uncontrolled. Lexapro increased from 5 mg to 10 mg once daily.  - escitalopram (LEXAPRO) 10 MG tablet; Take 1 tablet (10 mg total) by mouth daily.  Dispense: 30 tablet; Refill: 2  3. Difficulty sleeping - Sleep was better on Lexapro. Discussed controlling anxiety and depression and that we could potentially add something later to help with sleep - possibly Trazodone.   4. Chronic pain of left knee - Patient has a prescription for ibuprofen but does not like taking NSAIDs as she is concerned about kidney damage. Advised to try Voltaren gel.  - ibuprofen (ADVIL) 600 MG tablet; Take 1 tablet (600 mg total) by mouth every 8 (eight) hours as needed.  Dispense: 60 tablet; Refill: 2  5. Hot flashes - Encouraged to try [place] or [employer].   6. Eczema of both hands - Rx'd triamcinolone cream. Encouraged to wear damp cloth gloves under dish washing gloves at night while sleeping.   7. Tobacco use - Not interested in smoking cessation.  8. Colon cancer screening - Ambulatory referral to Gastroenterology  9-11. Screening for cervical cancer/Routine screening for STI (sexually transmitted infection)/Screening for human papillomavirus (HPV) - Cytology - PAP   Follow-up: Return in about 6 weeks (around 12/25/2019) for anxiety/depression.   Hendricks Limes, MSN, APRN, FNP-C [facility]  Subjective:  Patient ID: Tamara Mendoza, female    DOB: 1965/12/29  Age: 54 y.o. MRN: [id number]  Patient  Care Team: Loman Brooklyn, FNP as PCP - General (Family Medicine)   CC:  Chief Complaint  Patient presents with   Gynecologic Exam   Knee Pain    Patient was seen 8/19 for left knee pain- patient states that  she still has pain and swelling   Hot Flashes    Patient states she would like to know if there is anything else that can be done to help with hot flashes.    [name] presents for her annual physical.  Marital status: divorced, Substance use: denies Diet: none, Exercise: none Last eye exam: 2-3 years ago - needs glasses Last dental exam: a few months ago Last colonoscopy: never Last mammogram: never Last pap smear: 20+ years ago Immunizations: Flu Vaccine: declined Tdap Vaccine: declined  Shingrix Vaccine: patient reports having Shingrix last year at [place]  COVID-19 Vaccine: declined  Depression screen [facility] 2/9 11/13/2019 10/02/2019 02/28/2014  Decreased Interest 2 2 2   Down, Depressed, Hopeless 2 0 2  PHQ - 2 Score 4 2 4   Altered sleeping 3 2 2   Tired, decreased energy 2 2 1   Change in appetite 2 3 0  Feeling bad or failure about yourself  1 1 0  Trouble concentrating 1 0 0  Moving slowly or fidgety/restless 1 0 0  Suicidal thoughts 0 0 0  PHQ-9 Score 14 10 7    GAD 7 : Generalized Anxiety Score 11/13/2019 10/02/2019  Nervous, Anxious, on Edge 2 2  Control/stop worrying 3 3  Worry too much - different things 3 3  Trouble relaxing 2  1  Restless 1 2  Easily annoyed or irritable 1 2  Afraid - awful might happen 2 2  Total GAD 7 Score 14 15    Patient was started on Lexapro 5 mg once daily at her last visit which she cannot tell is helping. She does report she was sleeping better when she was taking it. She ran out of medication ~1 week ago.  Patient reports she is also having hot flashes and would like to know what she can do about them.    Review of Systems  Constitutional: Positive for diaphoresis. Negative for chills, fever, malaise/fatigue  and weight loss.  HENT: Negative for congestion, ear discharge, ear pain, nosebleeds, sinus pain, sore throat and tinnitus.   Eyes: Negative for blurred vision, double vision, pain, discharge and redness.  Respiratory: Negative for cough, shortness of breath and wheezing.   Cardiovascular: Negative for chest pain, palpitations and leg swelling.  Gastrointestinal: Negative for abdominal pain, constipation, diarrhea, heartburn, nausea and vomiting.  Genitourinary: Negative for dysuria, frequency and urgency.  Musculoskeletal: Positive for joint pain. Negative for myalgias.  Skin: Negative for rash.  Neurological: Negative for dizziness, seizures, weakness and headaches.  Psychiatric/Behavioral: Positive for depression. Negative for substance abuse and suicidal ideas. The patient is nervous/anxious and has insomnia.      Current Outpatient Medications:    escitalopram (LEXAPRO) 5 MG tablet, Take 1 tablet (5 mg total) by mouth daily., Disp: 30 tablet, Rfl: 0   ibuprofen (ADVIL) 600 MG tablet, Take 1 tablet (600 mg total) by mouth every 8 (eight) hours as needed., Disp: 30 tablet, Rfl: 0  No Known Allergies  History reviewed. No pertinent past medical history.  Past Surgical History:  Procedure Laterality Date   CESAREAN SECTION      Family History  Problem Relation Age of Onset   Cancer Mother        uterus   Hypertension Mother    Cancer Father    Diabetes Sister    Diabetes Brother    Diabetes Sister     Social History   Socioeconomic History   Marital status: Single    Spouse name: Not on file   Number of children: Not on file   Years of education: Not on file   Highest education level: Not on file  Occupational History   Not on file  Tobacco Use   Smoking status: Current Every Day Smoker    Packs/day: 0.25    Years: 30.00    Pack years: 7.50    Types: Cigarettes   Smokeless tobacco: Never Used  Scientific laboratory technician Use: Never used  Substance  and Sexual Activity   Alcohol use: Yes    Comment: occ   Drug use: No   Sexual activity: Not on file  Other Topics Concern   Not on file  Social History Narrative   Not on file   Social Determinants of Health   Financial Resource Strain:    Difficulty of Paying Living Expenses: Not on file  Food Insecurity:    Worried About Charity fundraiser in the Last Year: Not on file   [employer] of Food in the Last Year: Not on file  Transportation Needs:    Lack of Transportation (Medical): Not on file   Lack of Transportation (Non-Medical): Not on file  Physical Activity:    Days of Exercise per Week: Not on file   Minutes of Exercise per Session: Not on file  Stress:  Feeling of Stress : Not on file  Social Connections:    Frequency of Communication with Friends and Family: Not on file   Frequency of Social Gatherings with Friends and Family: Not on file   Attends Religious Services: Not on file   Active Member of Clubs or Organizations: Not on file   Attends Archivist Meetings: Not on file   Marital Status: Not on file  Intimate Partner Violence:    Fear of Current or Ex-Partner: Not on file   Emotionally Abused: Not on file   Physically Abused: Not on file   Sexually Abused: Not on file      Objective:    BP 117/77    Pulse 71    Temp 98.1 F (36.7 C) (Temporal)    Ht 5' 2"  (1.575 m)    Wt 188 lb 12.8 oz (85.6 kg)    SpO2 97%    BMI 34.53 kg/m   Wt Readings from Last 3 Encounters:  11/13/19 188 lb 12.8 oz (85.6 kg)  10/02/19 188 lb 6.4 oz (85.5 kg)  02/28/14 169 lb 9.6 oz (76.9 kg)    Physical Exam Vitals reviewed.  Constitutional:      General: She is not in acute distress.    Appearance: Normal appearance. She is obese. She is not ill-appearing, toxic-appearing or diaphoretic.  HENT:     Head: Normocephalic and atraumatic.     Right Ear: Tympanic membrane, ear canal and external ear normal. There is no impacted cerumen.      Left Ear: Tympanic membrane, ear canal and external ear normal. There is no impacted cerumen.     Nose: Nose normal. No congestion or rhinorrhea.     Mouth/Throat:     Mouth: Mucous membranes are moist.     Pharynx: Oropharynx is clear. No oropharyngeal exudate or posterior oropharyngeal erythema.  Eyes:     General: No scleral icterus.       Right eye: No discharge.        Left eye: No discharge.     Conjunctiva/sclera: Conjunctivae normal.     Pupils: Pupils are equal, round, and reactive to light.  Cardiovascular:     Rate and Rhythm: Normal rate and regular rhythm.     Heart sounds: Normal heart sounds. No murmur heard.  No friction rub. No gallop.   Pulmonary:     Effort: Pulmonary effort is normal. No respiratory distress.     Breath sounds: Normal breath sounds. No stridor. No wheezing, rhonchi or rales.  Abdominal:     General: Abdomen is flat. Bowel sounds are normal. There is no distension.     Palpations: Abdomen is soft. There is no mass.     Tenderness: There is no abdominal tenderness. There is no guarding or rebound.     Hernia: No hernia is present.  Musculoskeletal:        General: Normal range of motion.     Cervical back: Normal range of motion and neck supple. No rigidity. No muscular tenderness.  Lymphadenopathy:     Cervical: No cervical adenopathy.  Skin:    General: Skin is warm and dry.     Capillary Refill: Capillary refill takes less than 2 seconds.     Comments: Bilateral hands dry and cracking from eczema.   Neurological:     General: No focal deficit present.     Mental Status: She is alert and oriented to person, place, and time. Mental status is at baseline.  Psychiatric:        Mood and Affect: Mood normal.        Behavior: Behavior normal.        Thought Content: Thought content normal.        Judgment: Judgment normal.     No results found for: TSH No results found for: WBC, HGB, HCT, MCV, PLT No results found for: NA, K, CHLORIDE, CO2,  GLUCOSE, BUN, CREATININE, BILITOT, ALKPHOS, AST, ALT, PROT, ALBUMIN, CALCIUM, ANIONGAP, EGFR, GFR No results found for: CHOL No results found for: HDL No results found for: LDLCALC No results found for: TRIG No results found for: CHOLHDL No results found for: HGBA1C

## 2019-11-14 LAB — CYTOLOGY - PAP
Adequacy: ABSENT
Chlamydia: NEGATIVE
Comment: NEGATIVE
Comment: NEGATIVE
Comment: NEGATIVE
Comment: NORMAL
Diagnosis: NEGATIVE
High risk HPV: NEGATIVE
Neisseria Gonorrhea: NEGATIVE
Trichomonas: NEGATIVE

## 2019-11-14 LAB — CMP14+EGFR
ALT: 17 IU/L (ref 0–32)
AST: 17 IU/L (ref 0–40)
Albumin/Globulin Ratio: 2 (ref 1.2–2.2)
Albumin: 4.3 g/dL (ref 3.8–4.9)
Alkaline Phosphatase: 92 IU/L (ref 44–121)
BUN/Creatinine Ratio: 18 (ref 9–23)
BUN: 11 mg/dL (ref 6–24)
Bilirubin Total: 0.4 mg/dL (ref 0.0–1.2)
CO2: 24 mmol/L (ref 20–29)
Calcium: 9.1 mg/dL (ref 8.7–10.2)
Chloride: 102 mmol/L (ref 96–106)
Creatinine, Ser: 0.61 mg/dL (ref 0.57–1.00)
GFR calc Af Amer: 118 mL/min/{1.73_m2} (ref >59)
GFR calc non Af Amer: 102 mL/min/{1.73_m2} (ref >59)
Globulin, Total: 2.2 g/dL (ref 1.5–4.5)
Glucose: 88 mg/dL (ref 65–99)
Potassium: 4.2 mmol/L (ref 3.5–5.2)
Sodium: 139 mmol/L (ref 134–144)
Total Protein: 6.5 g/dL (ref 6.0–8.5)

## 2019-11-14 LAB — CBC WITH DIFFERENTIAL/PLATELET
Basophils Absolute: 0 10*3/uL (ref 0.0–0.2)
Basos: 1 %
EOS (ABSOLUTE): 0.1 10*3/uL (ref 0.0–0.4)
Eos: 1 %
Hematocrit: 43.2 % (ref 34.0–46.6)
Hemoglobin: 14.2 g/dL (ref 11.1–15.9)
Immature Grans (Abs): 0 10*3/uL (ref 0.0–0.1)
Immature Granulocytes: 0 %
Lymphocytes Absolute: 2.2 10*3/uL (ref 0.7–3.1)
Lymphs: 34 %
MCH: 28.3 pg (ref 26.6–33.0)
MCHC: 32.9 g/dL (ref 31.5–35.7)
MCV: 86 fL (ref 79–97)
Monocytes Absolute: 0.7 10*3/uL (ref 0.1–0.9)
Monocytes: 11 %
Neutrophils Absolute: 3.4 10*3/uL (ref 1.4–7.0)
Neutrophils: 53 %
Platelets: 268 10*3/uL (ref 150–450)
RBC: 5.02 x10E6/uL (ref 3.77–5.28)
RDW: 12.7 % (ref 11.7–15.4)
WBC: 6.4 10*3/uL (ref 3.4–10.8)

## 2019-11-14 LAB — LIPID PANEL
Chol/HDL Ratio: 5.9 ratio — ABNORMAL HIGH (ref 0.0–4.4)
Cholesterol, Total: 223 mg/dL — ABNORMAL HIGH (ref 100–199)
HDL: 38 mg/dL — ABNORMAL LOW (ref >39)
LDL Chol Calc (NIH): 155 mg/dL — ABNORMAL HIGH (ref 0–99)
Triglycerides: 163 mg/dL — ABNORMAL HIGH (ref 0–149)
VLDL Cholesterol Cal: 30 mg/dL (ref 5–40)

## 2019-11-14 LAB — TSH: TSH: 1.58 u[IU]/mL (ref 0.450–4.500)

## 2019-11-16 DIAGNOSIS — R232 Flushing: Secondary | ICD-10-CM | POA: Insufficient documentation

## 2019-11-16 DIAGNOSIS — L309 Dermatitis, unspecified: Secondary | ICD-10-CM | POA: Insufficient documentation

## 2019-11-16 DIAGNOSIS — G479 Sleep disorder, unspecified: Secondary | ICD-10-CM | POA: Insufficient documentation

## 2019-11-17 ENCOUNTER — Other Ambulatory Visit: Payer: Self-pay | Admitting: Family Medicine

## 2019-11-17 DIAGNOSIS — Z1231 Encounter for screening mammogram for malignant neoplasm of breast: Secondary | ICD-10-CM

## 2019-11-17 NOTE — Progress Notes (Signed)
She had both Shingrix in 2019. I will add in epic.

## 2019-11-18 ENCOUNTER — Encounter: Payer: Self-pay | Admitting: Internal Medicine

## 2019-11-27 ENCOUNTER — Ambulatory Visit: Payer: BC Managed Care – PPO | Admitting: Family Medicine

## 2019-12-25 ENCOUNTER — Ambulatory Visit: Payer: BC Managed Care – PPO | Admitting: Nurse Practitioner

## 2019-12-25 ENCOUNTER — Other Ambulatory Visit: Payer: Self-pay

## 2019-12-25 ENCOUNTER — Encounter: Payer: Self-pay | Admitting: Nurse Practitioner

## 2019-12-25 VITALS — BP 110/75 | HR 68 | Temp 97.5°F | Ht 62.0 in | Wt 189.4 lb

## 2019-12-25 DIAGNOSIS — M25562 Pain in left knee: Secondary | ICD-10-CM

## 2019-12-25 DIAGNOSIS — F419 Anxiety disorder, unspecified: Secondary | ICD-10-CM | POA: Diagnosis not present

## 2019-12-25 DIAGNOSIS — F32A Depression, unspecified: Secondary | ICD-10-CM | POA: Diagnosis not present

## 2019-12-25 MED ORDER — PREDNISONE 10 MG (21) PO TBPK: ORAL_TABLET | ORAL | 0 refills | Status: DC

## 2019-12-25 NOTE — Progress Notes (Signed)
New Patient Office Visit  Subjective:  Patient ID: Tamara Mendoza, female    DOB: [birth year]  Age: 54 y.o. MRN: [id number]  CC:  Chief Complaint  Patient presents with  . Depression    6 week follow up   . Knee Pain    left     HPI  Tamara Mendoza is following up 6 weeks  Depression      The patient presents with depression.  This is a recurrent problem.  The current episode started more than 1 year ago.   The onset quality is gradual.   The problem occurs constantly.  The problem has been gradually improving since onset.  Associated symptoms include decreased concentration, fatigue, hopelessness and decreased interest.  Associated symptoms include no suicidal ideas.     The symptoms are aggravated by family issues and social issues.  Past treatments include SSRIs - Selective serotonin reuptake inhibitors.  Compliance with treatment is good.  Previous treatment provided moderate relief.  Risk factors include stress.   Past medical history includes depression.   Knee Pain  Incident onset: few months ago. The incident occurred at home. There was no injury mechanism. The pain is present in the left knee. The pain is at a severity of 8/10. The pain has been constant since onset. Associated symptoms include muscle weakness. She reports no foreign bodies present. The symptoms are aggravated by movement. She has tried NSAIDs for the symptoms. The treatment provided mild relief.      Past Surgical History:  Procedure Laterality Date  . CESAREAN SECTION      Family History  Problem Relation Age of Onset  . Hypertension Mother   . Uterine cancer Mother   . Cancer Father   . Diabetes Sister   . Diabetes Brother   . Diabetes Sister     Social History   Socioeconomic History  . Marital status: Single    Spouse name: Not on file  . Number of children: Not on file  . Years of education: Not on file  . Highest education level: Not on file  Occupational History  . Not on file  Tobacco  Use  . Smoking status: Current Every Day Smoker    Packs/day: 0.25    Years: 30.00    Pack years: 7.50    Types: Cigarettes  . Smokeless tobacco: Never Used  Vaping Use  . Vaping Use: Never used  Substance and Sexual Activity  . Alcohol use: Yes    Comment: occ  . Drug use: No  . Sexual activity: Not on file  Other Topics Concern  . Not on file  Social History Narrative  . Not on file   Social Determinants of Health   Financial Resource Strain:   . Difficulty of Paying Living Expenses: Not on file  Food Insecurity:   . Worried About Charity fundraiser in the Last Year: Not on file  . Ran Out of Food in the Last Year: Not on file  Transportation Needs:   . Lack of Transportation (Medical): Not on file  . Lack of Transportation (Non-Medical): Not on file  Physical Activity:   . Days of Exercise per Week: Not on file  . Minutes of Exercise per Session: Not on file  Stress:   . Feeling of Stress : Not on file  Social Connections:   . Frequency of Communication with Friends and Family: Not on file  . Frequency of Social Gatherings with Friends and Family:  Not on file  . Attends Religious Services: Not on file  . Active Member of Clubs or Organizations: Not on file  . Attends Archivist Meetings: Not on file  . Marital Status: Not on file  Intimate Partner Violence:   . Fear of Current or Ex-Partner: Not on file  . Emotionally Abused: Not on file  . Physically Abused: Not on file  . Sexually Abused: Not on file    ROS Review of Systems  Constitutional: Positive for fatigue.  Musculoskeletal: Positive for joint swelling.  Skin: Negative.   Psychiatric/Behavioral: Positive for decreased concentration and depression. Negative for self-injury and suicidal ideas. The patient is nervous/anxious.   All other systems reviewed and are negative.   Objective:   Today's Vitals: BP 110/75   Pulse 68   Temp (!) 97.5 F (36.4 C) (Temporal)   Ht 5\' 2"  (1.575 m)    Wt 189 lb 6.4 oz (85.9 kg)   SpO2 97%   BMI 34.64 kg/m   Physical Exam Vitals reviewed.  Constitutional:      Appearance: Normal appearance.  HENT:     Head: Normocephalic.     Nose: Nose normal.  Eyes:     Conjunctiva/sclera: Conjunctivae normal.  Cardiovascular:     Rate and Rhythm: Normal rate and regular rhythm.     Pulses: Normal pulses.     Heart sounds: Normal heart sounds.  Pulmonary:     Effort: Pulmonary effort is normal.     Breath sounds: Normal breath sounds.  Abdominal:     General: Bowel sounds are normal.  Musculoskeletal:        General: Tenderness present.     Comments: Left knee pain  Skin:    General: Skin is warm.  Neurological:     Mental Status: She is alert and oriented to person, place, and time.  Psychiatric:     Comments: Positive for depression and anxiety     Assessment & Plan:   Problem List Items Addressed This Visit      Other   Acute pain of left knee    Not well managed, prednisone taper, continue anti-inflammatory, provided education to patient with printed handouts given.  Follow-up with worsening or unresolved symptoms.      Relevant Medications   predniSONE (STERAPRED UNI-PAK 21 TAB) 10 MG (21) TBPK tablet   Anxiety and depression - Primary    Well managed on current medication.  No changes to dose.  Follow-up in 3 months.         Outpatient Encounter Medications as of 12/25/2019  Medication Sig  . escitalopram (LEXAPRO) 10 MG tablet Take 1 tablet (10 mg total) by mouth daily.  Marland Kitchen ibuprofen (ADVIL) 600 MG tablet Take 1 tablet (600 mg total) by mouth every 8 (eight) hours as needed.  . triamcinolone cream (KENALOG) 0.1 % Apply 1 application topically 2 (two) times daily.   No facility-administered encounter medications on file as of 12/25/2019.    Follow-up: Return in about 3 months (around 03/26/2020).   Ivy Lynn, NP

## 2019-12-25 NOTE — Assessment & Plan Note (Signed)
Well managed on current medication.  No changes to dose.  Follow-up in 3 months.

## 2019-12-25 NOTE — Assessment & Plan Note (Signed)
Not well managed, prednisone taper, continue anti-inflammatory, provided education to patient with printed handouts given.  Follow-up with worsening or unresolved symptoms.

## 2019-12-25 NOTE — Patient Instructions (Addendum)
Continue on current dose of lexapro 10 mg daily and follow up in 3 months Continue antiinflammatory for knee pain, brace and rest.  Living With Depression Everyone experiences occasional disappointment, sadness, and loss in their lives. When you are feeling down, blue, or sad for at least 2 weeks in a row, it may mean that you have depression. Depression can affect your thoughts and feelings, relationships, daily activities, and physical health. It is caused by changes in the way your brain functions. If you receive a diagnosis of depression, your health care provider will tell you which type of depression you have and what treatment options are available to you. If you are living with depression, there are ways to help you recover from it and also ways to prevent it from coming back. How to cope with lifestyle changes Coping with stress     Stress is your body's reaction to life changes and events, both good and bad. Stressful situations may include:  Getting married.  The death of a spouse.  Losing a job.  Retiring.  Having a baby. Stress can last just a few hours or it can be ongoing. Stress can play a major role in depression, so it is important to learn both how to cope with stress and how to think about it differently. Talk with your health care provider or a counselor if you would like to learn more about stress reduction. He or she may suggest some stress reduction techniques, such as:  Music therapy. This can include creating music or listening to music. Choose music that you enjoy and that inspires you.  Mindfulness-based meditation. This kind of meditation can be done while sitting or walking. It involves being aware of your normal breaths, rather than trying to control your breathing.  Centering prayer. This is a kind of meditation that involves focusing on a spiritual word or phrase. Choose a word, phrase, or sacred image that is meaningful to you and that brings you  peace.  Deep breathing. To do this, expand your stomach and inhale slowly through your nose. Hold your breath for 3-5 seconds, then exhale slowly, allowing your stomach muscles to relax.  Muscle relaxation. This involves intentionally tensing muscles then relaxing them. Choose a stress reduction technique that fits your lifestyle and personality. Stress reduction techniques take time and practice to develop. Set aside 5-15 minutes a day to do them. Therapists can offer training in these techniques. The training may be covered by some insurance plans. Other things you can do to manage stress include:  Keeping a stress diary. This can help you learn what triggers your stress and ways to control your response.  Understanding what your limits are and saying no to requests or events that lead to a schedule that is too full.  Thinking about how you respond to certain situations. You may not be able to control everything, but you can control how you react.  Adding humor to your life by watching funny films or TV shows.  Making time for activities that help you relax and not feeling guilty about spending your time this way.  Medicines Your health care provider may suggest certain medicines if he or she feels that they will help improve your condition. Avoid using alcohol and other substances that may prevent your medicines from working properly (may interact). It is also important to:  Talk with your pharmacist or health care provider about all the medicines that you take, their possible side effects, and what medicines  are safe to take together.  Make it your goal to take part in all treatment decisions (shared decision-making). This includes giving input on the side effects of medicines. It is best if shared decision-making with your health care provider is part of your total treatment plan. If your health care provider prescribes a medicine, you may not notice the full benefits of it for 4-8 weeks.  Most people who are treated for depression need to be on medicine for at least 6-12 months after they feel better. If you are taking medicines as part of your treatment, do not stop taking medicines without first talking to your health care provider. You may need to have the medicine slowly decreased (tapered) over time to decrease the risk of harmful side effects. Relationships Your health care provider may suggest family therapy along with individual therapy and drug therapy. While there may not be family problems that are causing you to feel depressed, it is still important to make sure your family learns as much as they can about your mental health. Having your family's support can help make your treatment successful. How to recognize changes in your condition Everyone has a different response to treatment for depression. Recovery from major depression happens when you have not had signs of major depression for two months. This may mean that you will start to:  Have more interest in doing activities.  Feel less hopeless than you did 2 months ago.  Have more energy.  Overeat less often, or have better or improving appetite.  Have better concentration. Your health care provider will work with you to decide the next steps in your recovery. It is also important to recognize when your condition is getting worse. Watch for these signs:  Having fatigue or low energy.  Eating too much or too little.  Sleeping too much or too little.  Feeling restless, agitated, or hopeless.  Having trouble concentrating or making decisions.  Having unexplained physical complaints.  Feeling irritable, angry, or aggressive. Get help as soon as you or your family members notice these symptoms coming back. How to get support and help from others How to talk with friends and family members about your condition  Talking to friends and family members about your condition can provide you with one way to get  support and guidance. Reach out to trusted friends or family members, explain your symptoms to them, and let them know that you are working with a health care provider to treat your depression. Financial resources Not all insurance plans cover mental health care, so it is important to check with your insurance carrier. If paying for co-pays or counseling services is a problem, search for a local or county mental health care center. They may be able to offer public mental health care services at low or no cost when you are not able to see a private health care provider. If you are taking medicine for depression, you may be able to get the generic form, which may be less expensive. Some makers of prescription medicines also offer help to patients who cannot afford the medicines they need. Follow these instructions at home:   Get the right amount and quality of sleep.  Cut down on using caffeine, tobacco, alcohol, and other potentially harmful substances.  Try to exercise, such as walking or lifting small weights.  Take over-the-counter and prescription medicines only as told by your health care provider.  Eat a healthy diet that includes plenty of vegetables, fruits, whole  grains, low-fat dairy products, and lean protein. Do not eat a lot of foods that are high in solid fats, added sugars, or salt.  Keep all follow-up visits as told by your health care provider. This is important. Contact a health care provider if:  You stop taking your antidepressant medicines, and you have any of these symptoms: ? Nausea. ? Headache. ? Feeling lightheaded. ? Chills and body aches. ? Not being able to sleep (insomnia).  You or your friends and family think your depression is getting worse. Get help right away if:  You have thoughts of hurting yourself or others. If you ever feel like you may hurt yourself or others, or have thoughts about taking your own life, get help right away. You can go to your  nearest emergency department or call:  Your local emergency services (911 in the [place]  A suicide crisis helpline, such as the [facility] at (407)010-6584. This is open 24-hours a day. Summary  If you are living with depression, there are ways to help you recover from it and also ways to prevent it from coming back.  Work with your health care team to create a management plan that includes counseling, stress management techniques, and healthy lifestyle habits. This information is not intended to replace advice given to you by your health care provider. Make sure you discuss any questions you have with your health care provider. Document Revised: 05/24/2018 Document Reviewed: 01/03/2016 Elsevier Patient Education  [place].

## 2020-01-01 ENCOUNTER — Ambulatory Visit: Payer: Self-pay

## 2020-01-12 ENCOUNTER — Ambulatory Visit
Admission: RE | Admit: 2020-01-12 | Discharge: 2020-01-12 | Disposition: A | Discharge status: Home / Self Care | Payer: BC Managed Care – PPO | Source: Ambulatory Visit | Attending: Family Medicine | Admitting: Family Medicine

## 2020-01-12 ENCOUNTER — Other Ambulatory Visit: Payer: Self-pay

## 2020-01-12 DIAGNOSIS — Z1231 Encounter for screening mammogram for malignant neoplasm of breast: Secondary | ICD-10-CM

## 2020-01-22 ENCOUNTER — Ambulatory Visit (INDEPENDENT_AMBULATORY_CARE_PROVIDER_SITE_OTHER): Payer: Self-pay | Admitting: *Deleted

## 2020-01-22 ENCOUNTER — Other Ambulatory Visit: Payer: Self-pay

## 2020-01-22 VITALS — Ht 62.0 in | Wt 190.4 lb

## 2020-01-22 DIAGNOSIS — Z1211 Encounter for screening for malignant neoplasm of colon: Secondary | ICD-10-CM

## 2020-01-22 MED ORDER — PEG 3350-KCL-NA BICARB-NACL 420 G PO SOLR: 4000.0000 mL | Freq: Once | ORAL | 0 refills | Status: AC

## 2020-01-22 NOTE — Progress Notes (Signed)
Gastroenterology Pre-Procedure Review  Request Date: 01/22/2020 Requesting Physician: Delight Ovens, NP @ [facility], no previous TCS  PATIENT REVIEW QUESTIONS: The patient responded to the following health history questions as indicated:    1. Diabetes Melitis: no 2. Joint replacements in the past 12 months: no 3. Major health problems in the past 3 months: no 4. Has an artificial valve or MVP: no 5. Has a defibrillator: no 6. Has been advised in past to take antibiotics in advance of a procedure like teeth cleaning: no 7. Family history of colon cancer: no 8. Alcohol Use: yes, 1 glass of wine or liquor twice a month 9. Illicit drug Use: no 10. History of sleep apnea: no 11. History of coronary artery or other vascular stents placed within the last 12 months: no 12. History of any prior anesthesia complications: no 13. Body mass index is 34.82 kg/m.    MEDICATIONS & ALLERGIES:    Patient reports the following regarding taking any blood thinners:   Plavix? no Aspirin? no Coumadin? no Brilinta? no Xarelto? no Eliquis? no Pradaxa? no Savaysa? no Effient?   Patient confirms/reports the following medications:  Current Outpatient Medications  Medication Sig Dispense Refill  . escitalopram (LEXAPRO) 10 MG tablet Take 1 tablet (10 mg total) by mouth daily. 30 tablet 2  . triamcinolone cream (KENALOG) 0.1 % Apply 1 application topically 2 (two) times daily. (Patient taking differently: Apply 1 application topically as needed.) 80 g 2   No current facility-administered medications for this visit.    Patient confirms/reports the following allergies:  No Known Allergies  No orders of the defined types were placed in this encounter.   AUTHORIZATION INFORMATION Primary Insurance: [place] ,  [place] #:[id number] ,  Group #: [id number] Pre-Cert / Josem Kaufmann required: No, file to local BC[employer]  SCHEDULE INFORMATION: Procedure has been scheduled as follows:  Date: 03/12/2020, Time: 11:00 Location:  [facility] with Dr. Abbey Chatters  This Gastroenterology Pre-Precedure Review Form is being routed to the following provider(s): Walden Field, NP

## 2020-01-22 NOTE — Patient Instructions (Addendum)
Tamara Mendoza   1965-08-07 MRN: [id number]    Procedure Date: 04/05/2020 Time to register: 9:00 am Place to register: [facility] Procedure Time: 10:30 am Scheduled provider: Dr. Abbey Chatters  PREPARATION FOR COLONOSCOPY WITH TRI-LYTE SPLIT PREP  Please notify us immediately if you are diabetic, take iron supplements, or if you are on Coumadin or any other blood thinners.   Please hold the following medications: n/a  You will need to purchase 1 fleet enema and 1 box of Bisacodyl 29m tablets.   2 DAYS BEFORE PROCEDURE:  DATE: 04/03/2020  DAY: Saturday Begin clear liquid diet AFTER your lunch meal. NO SOLID FOODS after this point.  1 DAY BEFORE PROCEDURE:  DATE: 04/04/2020   DAY: Sunday Continue clear liquids the entire day - NO SOLID FOOD.   Diabetic medications adjustments for today: n/a  At 2:00 pm:  Take 2 Bisacodyl tablets.   At 4:00pm:  Start drinking your solution. Make sure you mix well per instructions on the bottle. Try to drink 1 (one) 8 ounce glass every 10-15 minutes until you have consumed HALF the jug. You should complete by 6:00pm.You must keep the left over solution refrigerated until completed next day.  Continue clear liquids. You must drink plenty of clear liquids to prevent dehyration and kidney failure.     DAY OF PROCEDURE:   DATE: 04/05/2020   DAY: Monday If you take medications for your heart, blood pressure or breathing, you may take these medications.  Diabetic medications adjustments for today: n/a  Five hours before your procedure time @ 5:30 am:  Finish remaining amout of bowel prep, drinking 1 (one) 8 ounce glass every 10-15 minutes until complete. You have two hours to consume remaining prep.   Three hours before your procedure time @ 7:30 am:  Nothing by mouth.   At least one hour before going to the hospital:  Give yourself one Fleet enema. You may take your morning medications with sip of water unless we have instructed otherwise.       Please see below for Dietary Information.  CLEAR LIQUIDS INCLUDE:  Water Jello (NOT red in color)   Ice Popsicles (NOT red in color)   Tea (sugar ok, no milk/cream) Powdered fruit flavored drinks  Coffee (sugar ok, no milk/cream) Gatorade/ Lemonade/ Kool-Aid  (NOT red in color)   Juice: apple, white grape, white cranberry Soft drinks  Clear bullion, consomme, broth (fat free beef/chicken/vegetable)  Carbonated beverages (any kind)  Strained chicken noodle soup Hard Candy   Remember: Clear liquids are liquids that will allow you to see your fingers on the other side of a clear glass. Be sure liquids are NOT red in color, and not cloudy, but CLEAR.  DO NOT EAT OR DRINK ANY OF THE FOLLOWING:  Dairy products of any kind   Cranberry juice Tomato juice / V8 juice   Grapefruit juice Orange juice     Red grape juice  Do not eat any solid foods, including such foods as: cereal, oatmeal, yogurt, fruits, vegetables, creamed soups, eggs, bread, crackers, pureed foods in a blender, etc.   HELPFUL HINTS FOR DRINKING PREP SOLUTION:   Make sure prep is extremely cold. Mix and refrigerate the the morning of the prep. You may also put in the freezer.   You may try mixing some Crystal Light or Country Time Lemonade if you prefer. Mix in small amounts; add more if necessary.  Try drinking through a straw  Rinse mouth with water or  a mouthwash between glasses, to remove after-taste.  Try sipping on a cold beverage /ice/ popsicles between glasses of prep.  Place a piece of sugar-free hard candy in mouth between glasses.  If you become nauseated, try consuming smaller amounts, or stretch out the time between glasses. Stop for 30-60 minutes, then slowly start back drinking.        OTHER INSTRUCTIONS  You will need a responsible adult at least 55 years of age to accompany you and drive you home. This person must remain in the waiting room during your procedure. The hospital will cancel  your procedure if you do not have a responsible adult with you.   1. Wear loose fitting clothing that is easily removed. 2. Leave jewelry and other valuables at home.  3. Remove all body piercing jewelry and leave at home. 4. Total time from sign-in until discharge is approximately 2-3 hours. 5. You should go home directly after your procedure and rest. You can resume normal activities the day after your procedure. 6. The day of your procedure you should not:  Drive  Make legal decisions  Operate machinery  Drink alcohol  Return to work   You may call the office (Dept: [phone]) before 5:00pm, or page the doctor on call [phone]) after 5:00pm, for further instructions, if necessary.   Insurance Information YOU WILL NEED TO CHECK WITH YOUR INSURANCE COMPANY FOR THE BENEFITS OF COVERAGE YOU HAVE FOR THIS PROCEDURE.  UNFORTUNATELY, NOT ALL INSURANCE COMPANIES HAVE BENEFITS TO COVER ALL OR PART OF THESE TYPES OF PROCEDURES.  IT IS YOUR RESPONSIBILITY TO CHECK YOUR BENEFITS, HOWEVER, WE WILL BE GLAD TO ASSIST YOU WITH ANY CODES YOUR INSURANCE COMPANY MAY NEED.    PLEASE NOTE THAT MOST INSURANCE COMPANIES WILL NOT COVER A SCREENING COLONOSCOPY FOR PEOPLE UNDER THE AGE OF 50  IF YOU HAVE BCBS INSURANCE, YOU MAY HAVE BENEFITS FOR A SCREENING COLONOSCOPY BUT IF POLYPS ARE FOUND THE DIAGNOSIS WILL CHANGE AND THEN YOU MAY HAVE A DEDUCTIBLE THAT WILL NEED TO BE MET. SO PLEASE MAKE SURE YOU CHECK YOUR BENEFITS FOR A SCREENING COLONOSCOPY AS WELL AS A DIAGNOSTIC COLONOSCOPY.

## 2020-01-23 NOTE — Progress Notes (Signed)
Ok to schedule.  ASA I/II based on limited history available in the chart

## 2020-02-17 ENCOUNTER — Other Ambulatory Visit: Payer: Self-pay | Admitting: Family Medicine

## 2020-02-17 DIAGNOSIS — F419 Anxiety disorder, unspecified: Secondary | ICD-10-CM

## 2020-02-17 DIAGNOSIS — F32A Depression, unspecified: Secondary | ICD-10-CM

## 2020-02-17 NOTE — Telephone Encounter (Signed)
  Prescription Request  02/17/2020  What is the name of the medication or equipment? escitalopram (LEXAPRO) 10 MG tablet   Have you contacted your pharmacy to request a refill? (if applicable) yes  Which pharmacy would you like this sent to? [facility] [place] pt has appt with Alona Bene on 04/01/20   Patient notified that their request is being sent to the clinical staff for review and that they should receive a response within 2 business days.

## 2020-03-10 ENCOUNTER — Encounter (HOSPITAL_COMMUNITY): Payer: Self-pay

## 2020-03-10 ENCOUNTER — Other Ambulatory Visit (HOSPITAL_COMMUNITY)
Admission: RE | Admit: 2020-03-10 | Discharge: 2020-03-10 | Disposition: A | Discharge status: Home / Self Care | Payer: BC Managed Care – PPO | Source: Ambulatory Visit | Attending: Internal Medicine | Admitting: Internal Medicine

## 2020-03-10 ENCOUNTER — Other Ambulatory Visit: Payer: Self-pay

## 2020-03-10 DIAGNOSIS — U071 COVID-19: Secondary | ICD-10-CM | POA: Diagnosis not present

## 2020-03-10 DIAGNOSIS — Z01812 Encounter for preprocedural laboratory examination: Secondary | ICD-10-CM | POA: Diagnosis present

## 2020-03-10 LAB — SARS CORONAVIRUS 2 (TAT 6-24 HRS): SARS Coronavirus 2: POSITIVE — AB

## 2020-03-11 ENCOUNTER — Telehealth: Payer: Self-pay | Admitting: *Deleted

## 2020-03-11 ENCOUNTER — Telehealth: Payer: Self-pay | Admitting: Internal Medicine

## 2020-03-11 ENCOUNTER — Telehealth: Payer: Self-pay

## 2020-03-11 NOTE — OR Nursing (Signed)
Patient covid positive. Dr. Abbey Chatters notified. Zeb Comfort at the office notified that patient will need to be cancelled and rescheduled.

## 2020-03-11 NOTE — Telephone Encounter (Signed)
Spoke to pt.  She is aware that she tested positive for Covid and that her procedure for tomorrow has to be rescheduled.  Pt rescheduled to 04/05/2020 at 10:30 with arrival at 9:00.  Pt aware that I am mailing out new prep instructions.  Pt made aware that she did not have to do another covid test as long as her procedure is within a 90 day period from her positive covid test result.  Pt voiced understanding.  Called [facility] in [facility] and made her aware of new procedure date.

## 2020-03-11 NOTE — Telephone Encounter (Signed)
Patient tested positive for Covid, she has no symptoms at this time.  I advised patient if she developed any symptoms such as congestion, cough or fever she could take OTC Mucinex, Tylenol and drink lots of fluids.  If she developed any shortness of breath, she should go to the ER.  Patient voiced understanding.

## 2020-03-11 NOTE — Telephone Encounter (Signed)
Melanie from Short Stay called to let scheduler know that patient tested positive and her procedure will need to be rescheduled. She is on with Dr Abbey Chatters for tomorrow.

## 2020-03-11 NOTE — Telephone Encounter (Signed)
Called to discuss with patient about COVID-19 symptoms and the use of one of the available treatments for those with mild to moderate Covid symptoms and at a high risk of hospitalization.  Pt appears to qualify for outpatient treatment due to co-morbid conditions and/or a member of an at-risk group in accordance with the FDA Emergency Use Authorization.   Talked with the patient who reports she has no symptoms..   Symptom onset: No symptoms Vaccinated:  Booster?  Immunocompromised?  Qualifiers:      Tamara Mendoza

## 2020-04-01 ENCOUNTER — Encounter: Payer: Self-pay | Admitting: Family Medicine

## 2020-04-01 ENCOUNTER — Ambulatory Visit: Payer: BC Managed Care – PPO | Admitting: Family Medicine

## 2020-04-01 ENCOUNTER — Other Ambulatory Visit: Payer: Self-pay

## 2020-04-01 VITALS — BP 102/69 | HR 85 | Temp 97.8°F | Ht 62.0 in | Wt 185.2 lb

## 2020-04-01 DIAGNOSIS — L309 Dermatitis, unspecified: Secondary | ICD-10-CM

## 2020-04-01 DIAGNOSIS — F419 Anxiety disorder, unspecified: Secondary | ICD-10-CM | POA: Diagnosis not present

## 2020-04-01 DIAGNOSIS — E782 Mixed hyperlipidemia: Secondary | ICD-10-CM

## 2020-04-01 DIAGNOSIS — M1990 Unspecified osteoarthritis, unspecified site: Secondary | ICD-10-CM | POA: Insufficient documentation

## 2020-04-01 DIAGNOSIS — F32A Depression, unspecified: Secondary | ICD-10-CM

## 2020-04-01 DIAGNOSIS — Z0000 Encounter for general adult medical examination without abnormal findings: Secondary | ICD-10-CM

## 2020-04-01 MED ORDER — MELOXICAM 7.5 MG PO TABS: 7.5000 mg | ORAL_TABLET | Freq: Every day | ORAL | 5 refills | Status: DC

## 2020-04-01 MED ORDER — ESCITALOPRAM OXALATE 10 MG PO TABS: 10.0000 mg | ORAL_TABLET | Freq: Every day | ORAL | 1 refills | Status: DC

## 2020-04-01 NOTE — Patient Instructions (Signed)
High Cholesterol  High cholesterol is a condition in which the blood has high levels of a white, waxy substance similar to fat (cholesterol). The liver makes all the cholesterol that the body needs. The human body needs small amounts of cholesterol to help build cells. A person gets extra or excess cholesterol from the food that he or she eats. The blood carries cholesterol from the liver to the rest of the body. If you have high cholesterol, deposits (plaques) may build up on the walls of your arteries. Arteries are the blood vessels that carry blood away from your heart. These plaques make the arteries narrow and stiff. Cholesterol plaques increase your risk for heart attack and stroke. Work with your health care provider to keep your cholesterol levels in a healthy range. What increases the risk? The following factors may make you more likely to develop this condition:  Eating foods that are high in animal fat (saturated fat) or cholesterol.  Being overweight.  Not getting enough exercise.  A family history of high cholesterol (familial hypercholesterolemia).  Use of tobacco products.  Having diabetes. What are the signs or symptoms? There are no symptoms of this condition. How is this diagnosed? This condition may be diagnosed based on the results of a blood test.  If you are older than 56 years of age, your health care provider may check your cholesterol levels every 4-6 years.  You may be checked more often if you have high cholesterol or other risk factors for heart disease. The blood test for cholesterol measures:  "Bad" cholesterol, or LDL cholesterol. This is the main type of cholesterol that causes heart disease. The desired level is less than 100 mg/dL.  "Good" cholesterol, or HDL cholesterol. HDL helps protect against heart disease by cleaning the arteries and carrying the LDL to the liver for processing. The desired level for HDL is 60 mg/dL or higher.  Triglycerides.  These are fats that your body can store or burn for energy. The desired level is less than 150 mg/dL.  Total cholesterol. This measures the total amount of cholesterol in your blood and includes LDL, HDL, and triglycerides. The desired level is less than 200 mg/dL. How is this treated? This condition may be treated with:  Diet changes. You may be asked to eat foods that have more fiber and less saturated fats or added sugar.  Lifestyle changes. These may include regular exercise, maintaining a healthy weight, and quitting use of tobacco products.  Medicines. These are given when diet and lifestyle changes have not worked. You may be prescribed a statin medicine to help lower your cholesterol levels. Follow these instructions at home: Eating and drinking  Eat a healthy, balanced diet. This diet includes: ? Daily servings of a variety of fresh, frozen, or canned fruits and vegetables. ? Daily servings of whole grain foods that are rich in fiber. ? Foods that are low in saturated fats and trans fats. These include poultry and fish without skin, lean cuts of meat, and low-fat dairy products. ? A variety of fish, especially oily fish that contain omega-3 fatty acids. Aim to eat fish at least 2 times a week.  Avoid foods and drinks that have added sugar.  Use healthy cooking methods, such as roasting, grilling, broiling, baking, poaching, steaming, and stir-frying. Do not fry your food except for stir-frying.   Lifestyle  Get regular exercise. Aim to exercise for a total of 150 minutes a week. Increase your activity level by doing activities   such as gardening, walking, and taking the stairs.  Do not use any products that contain nicotine or tobacco, such as cigarettes, e-cigarettes, and chewing tobacco. If you need help quitting, ask your health care provider.   General instructions  Take over-the-counter and prescription medicines only as told by your health care provider.  Keep all  follow-up visits as told by your health care provider. This is important. Where to find more information  American Heart Association: www.heart.org  National Heart, Lung, and Blood Institute: www.nhlbi.nih.gov Contact a health care provider if:  You have trouble achieving or maintaining a healthy diet or weight.  You are starting an exercise program.  You are unable to stop smoking. Get help right away if:  You have chest pain.  You have trouble breathing.  You have any symptoms of a stroke. "BE FAST" is an easy way to remember the main warning signs of a stroke: ? B - Balance. Signs are dizziness, sudden trouble walking, or loss of balance. ? E - Eyes. Signs are trouble seeing or a sudden change in vision. ? F - Face. Signs are sudden weakness or numbness of the face, or the face or eyelid drooping on one side. ? A - Arms. Signs are weakness or numbness in an arm. This happens suddenly and usually on one side of the body. ? S - Speech. Signs are sudden trouble speaking, slurred speech, or trouble understanding what people say. ? T - Time. Time to call emergency services. Write down what time symptoms started.  You have other signs of a stroke, such as: ? A sudden, severe headache with no known cause. ? Nausea or vomiting. ? Seizure. These symptoms may represent a serious problem that is an emergency. Do not wait to see if the symptoms will go away. Get medical help right away. Call your local emergency services (911 in the [place] Do not drive yourself to the hospital. Summary  Cholesterol plaques increase your risk for heart attack and stroke. Work with your health care provider to keep your cholesterol levels in a healthy range.  Eat a healthy, balanced diet, get regular exercise, and maintain a healthy weight.  Do not use any products that contain nicotine or tobacco, such as cigarettes, e-cigarettes, and chewing tobacco.  Get help right away if you have any symptoms of a  stroke. This information is not intended to replace advice given to you by your health care provider. Make sure you discuss any questions you have with your health care provider. Document Revised: 12/30/2018 Document Reviewed: 12/30/2018 Elsevier Patient Education  2021 Elsevier Inc.  

## 2020-04-01 NOTE — Progress Notes (Signed)
Assessment & Plan:  1. Anxiety and depression Well controlled on current regimen.  - escitalopram (LEXAPRO) 10 MG tablet; Take 1 tablet (10 mg total) by mouth daily.  Dispense: 90 tablet; Refill: 1  2. Arthritis Rx'd meloxicam. - meloxicam (MOBIC) 7.5 MG tablet; Take 1 tablet (7.5 mg total) by mouth daily.  Dispense: 30 tablet; Refill: 5  3. Eczema of both hands Controlled with triamcinolone cream.  4. Mixed hyperlipidemia Uncontrolled.  Encourage diet, exercise, and smoking cessation.  Education provided on hyperlipidemia.  Labs not obtained today as patient is not fasting.  Discussed we would need to recheck at her next follow-up.  5. Healthcare maintenance Patient is scheduled to get a colonoscopy next week.   Return in about 4 months (around 07/30/2020) for follow-up of chronic medication conditions.  Hendricks Limes, MSN, APRN, FNP-C [facility]  Subjective:    Patient ID: Tamara Mendoza, female    DOB: 10-24-1965, 55 y.o.   MRN: [id number]  Patient Care Team: Loman Brooklyn, FNP as PCP - General (Family Medicine) Eloise Harman, DO as Consulting Physician (Internal Medicine)   Chief Complaint:  Chief Complaint  Patient presents with  . Anxiety  . Depression    3 month follow up    HPI: Tamara Mendoza is a 55 y.o. female presenting on 04/01/2020 for Anxiety and Depression (3 month follow up)  Patient is here for a follow-up of anxiety and depression.  She feels like her medication is working well for her and does not desire a dosage increase.  She states her biggest stressor right now is helping take care of her 69 year old granddaughter.  Depression screen [facility] 2/9 12/25/2019 11/13/2019 10/02/2019  Decreased Interest 2 2 2   Down, Depressed, Hopeless 1 2 0  PHQ - 2 Score 3 4 2   Altered sleeping 2 3 2   Tired, decreased energy 1 2 2   Change in appetite 3 2 3   Feeling bad or failure about yourself  1 1 1   Trouble concentrating 2 1 0  Moving  slowly or fidgety/restless 1 1 0  Suicidal thoughts 0 0 0  PHQ-9 Score 13 14 10   Difficult doing work/chores Somewhat difficult - -   GAD 7 : Generalized Anxiety Score 12/25/2019 11/13/2019 10/02/2019  Nervous, Anxious, on Edge 1 2 2   Control/stop worrying 3 3 3   Worry too much - different things 3 3 3   Trouble relaxing 2 2 1   Restless 1 1 2   Easily annoyed or irritable 2 1 2   Afraid - awful might happen 1 2 2   Total GAD 7 Score 13 14 15   Anxiety Difficulty Somewhat difficult - -    New complaints: Patient reports her right knee is starting to bother her because of how she is compensating for her left knee.  She does not take anything for pain relief.  Social history:  Relevant past medical, surgical, family and social history reviewed and updated as indicated. Interim medical history since our last visit reviewed.  Allergies and medications reviewed and updated.  DATA REVIEWED: CHART IN EPIC  ROS: Negative unless specifically indicated above in HPI.    Current Outpatient Medications:  .  Cholecalciferol (VITAMIN D3) 10 MCG (400 UNIT) tablet, Take 400 Units by mouth daily., Disp: , Rfl:  .  escitalopram (LEXAPRO) 10 MG tablet, Take 1 tablet by mouth once daily (Patient taking differently: Take 10 mg by mouth daily.), Disp: 30 tablet, Rfl: 1 .  triamcinolone cream (KENALOG) 0.1 %,  Apply 1 application topically 2 (two) times daily. (Patient taking differently: Apply 1 application topically daily as needed (Rash).), Disp: 80 g, Rfl: 2 .  vitamin B-12 (CYANOCOBALAMIN) 500 MCG tablet, Take 500 mcg by mouth daily., Disp: , Rfl:    No Known Allergies History reviewed. No pertinent past medical history.  Past Surgical History:  Procedure Laterality Date  . CESAREAN SECTION      Social History   Socioeconomic History  . Marital status: Single    Spouse name: Not on file  . Number of children: Not on file  . Years of education: Not on file  . Highest education level: Not on file   Occupational History  . Not on file  Tobacco Use  . Smoking status: Current Every Day Smoker    Packs/day: 0.25    Years: 30.00    Pack years: 7.50    Types: Cigarettes  . Smokeless tobacco: Never Used  Vaping Use  . Vaping Use: Never used  Substance and Sexual Activity  . Alcohol use: Yes    Comment: occ  . Drug use: No  . Sexual activity: Not on file  Other Topics Concern  . Not on file  Social History Narrative  . Not on file   Social Determinants of Health   Financial Resource Strain: Not on file  Food Insecurity: Not on file  Transportation Needs: Not on file  Physical Activity: Not on file  Stress: Not on file  Social Connections: Not on file  Intimate Partner Violence: Not on file        Objective:    BP 102/69   Pulse 85   Temp 97.8 F (36.6 C) (Temporal)   Ht 5\' 2"  (1.575 m)   Wt 185 lb 3.2 oz (84 kg)   SpO2 96%   BMI 33.87 kg/m   Wt Readings from Last 3 Encounters:  04/01/20 185 lb 3.2 oz (84 kg)  01/22/20 190 lb 6.4 oz (86.4 kg)  12/25/19 189 lb 6.4 oz (85.9 kg)    Physical Exam Vitals reviewed.  Constitutional:      General: She is not in acute distress.    Appearance: Normal appearance. She is obese. She is not ill-appearing, toxic-appearing or diaphoretic.  HENT:     Head: Normocephalic and atraumatic.  Eyes:     General: No scleral icterus.       Right eye: No discharge.        Left eye: No discharge.     Conjunctiva/sclera: Conjunctivae normal.  Cardiovascular:     Rate and Rhythm: Normal rate.  Pulmonary:     Effort: Pulmonary effort is normal. No respiratory distress.  Musculoskeletal:        General: Normal range of motion.     Cervical back: Normal range of motion.  Skin:    General: Skin is warm and dry.     Capillary Refill: Capillary refill takes less than 2 seconds.  Neurological:     General: No focal deficit present.     Mental Status: She is alert and oriented to person, place, and time. Mental status is at  baseline.  Psychiatric:        Mood and Affect: Mood normal.        Behavior: Behavior normal.        Thought Content: Thought content normal.        Judgment: Judgment normal.     Lab Results  Component Value Date   TSH 1.580 11/13/2019  Lab Results  Component Value Date   WBC 6.4 11/13/2019   HGB 14.2 11/13/2019   HCT 43.2 11/13/2019   MCV 86 11/13/2019   PLT 268 11/13/2019   Lab Results  Component Value Date   NA 139 11/13/2019   K 4.2 11/13/2019   CO2 24 11/13/2019   GLUCOSE 88 11/13/2019   BUN 11 11/13/2019   CREATININE 0.61 11/13/2019   BILITOT 0.4 11/13/2019   ALKPHOS 92 11/13/2019   AST 17 11/13/2019   ALT 17 11/13/2019   PROT 6.5 11/13/2019   ALBUMIN 4.3 11/13/2019   CALCIUM 9.1 11/13/2019   Lab Results  Component Value Date   CHOL 223 (H) 11/13/2019   Lab Results  Component Value Date   HDL 38 (L) 11/13/2019   Lab Results  Component Value Date   LDLCALC 155 (H) 11/13/2019   Lab Results  Component Value Date   TRIG 163 (H) 11/13/2019   Lab Results  Component Value Date   CHOLHDL 5.9 (H) 11/13/2019   No results found for: HGBA1C

## 2020-04-02 ENCOUNTER — Encounter (HOSPITAL_COMMUNITY): Payer: Self-pay

## 2020-04-05 ENCOUNTER — Encounter (HOSPITAL_COMMUNITY): Payer: Self-pay | Admitting: *Deleted

## 2020-04-05 ENCOUNTER — Ambulatory Visit (HOSPITAL_COMMUNITY)
Admission: RE | Admit: 2020-04-05 | Discharge: 2020-04-05 | Disposition: A | Discharge status: Home / Self Care | Payer: BC Managed Care – PPO | Attending: Internal Medicine | Admitting: Internal Medicine

## 2020-04-05 ENCOUNTER — Encounter (HOSPITAL_COMMUNITY)
Admission: RE | Disposition: A | Discharge status: Home / Self Care | Payer: Self-pay | Source: Home / Self Care | Attending: Internal Medicine

## 2020-04-05 ENCOUNTER — Ambulatory Visit (HOSPITAL_COMMUNITY): Payer: BC Managed Care – PPO | Admitting: Anesthesiology

## 2020-04-05 ENCOUNTER — Other Ambulatory Visit: Payer: Self-pay

## 2020-04-05 DIAGNOSIS — Z791 Long term (current) use of non-steroidal anti-inflammatories (NSAID): Secondary | ICD-10-CM | POA: Insufficient documentation

## 2020-04-05 DIAGNOSIS — Z79899 Other long term (current) drug therapy: Secondary | ICD-10-CM | POA: Diagnosis not present

## 2020-04-05 DIAGNOSIS — Z1211 Encounter for screening for malignant neoplasm of colon: Secondary | ICD-10-CM

## 2020-04-05 DIAGNOSIS — D125 Benign neoplasm of sigmoid colon: Secondary | ICD-10-CM | POA: Diagnosis not present

## 2020-04-05 DIAGNOSIS — K6289 Other specified diseases of anus and rectum: Secondary | ICD-10-CM

## 2020-04-05 DIAGNOSIS — D123 Benign neoplasm of transverse colon: Secondary | ICD-10-CM | POA: Insufficient documentation

## 2020-04-05 DIAGNOSIS — K5730 Diverticulosis of large intestine without perforation or abscess without bleeding: Secondary | ICD-10-CM | POA: Insufficient documentation

## 2020-04-05 DIAGNOSIS — K635 Polyp of colon: Secondary | ICD-10-CM | POA: Diagnosis not present

## 2020-04-05 DIAGNOSIS — F17210 Nicotine dependence, cigarettes, uncomplicated: Secondary | ICD-10-CM | POA: Diagnosis not present

## 2020-04-05 DIAGNOSIS — K648 Other hemorrhoids: Secondary | ICD-10-CM | POA: Insufficient documentation

## 2020-04-05 DIAGNOSIS — D120 Benign neoplasm of cecum: Secondary | ICD-10-CM | POA: Diagnosis not present

## 2020-04-05 HISTORY — PX: BIOPSY: SHX5522

## 2020-04-05 HISTORY — PX: POLYPECTOMY: SHX5525

## 2020-04-05 HISTORY — PX: COLONOSCOPY WITH PROPOFOL: SHX5780

## 2020-04-05 SURGERY — COLONOSCOPY WITH PROPOFOL
Anesthesia: General

## 2020-04-05 MED ORDER — STERILE WATER FOR IRRIGATION IR SOLN
Status: DC | PRN
  Administered 2020-04-05: 1.5 mL

## 2020-04-05 MED ORDER — CHLORHEXIDINE GLUCONATE CLOTH 2 % EX PADS: 6.0000 | MEDICATED_PAD | Freq: Once | CUTANEOUS | Status: DC

## 2020-04-05 MED ORDER — LACTATED RINGERS IV SOLN: INTRAVENOUS | Status: DC

## 2020-04-05 MED ORDER — LIDOCAINE HCL (CARDIAC) PF 100 MG/5ML IV SOSY
PREFILLED_SYRINGE | INTRAVENOUS | Status: DC | PRN
  Administered 2020-04-05: 50 mg via INTRAVENOUS

## 2020-04-05 MED ORDER — PROPOFOL 10 MG/ML IV BOLUS
INTRAVENOUS | Status: DC | PRN
Start: 2020-04-05 — End: 2020-04-05
  Administered 2020-04-05: 130 mg via INTRAVENOUS
  Administered 2020-04-05: 20 mg via INTRAVENOUS
  Administered 2020-04-05: 30 mg via INTRAVENOUS
  Administered 2020-04-05: 20 mg via INTRAVENOUS
  Administered 2020-04-05: 30 mg via INTRAVENOUS

## 2020-04-05 NOTE — Anesthesia Postprocedure Evaluation (Signed)
Anesthesia Post Note  Patient: Tamara Mendoza  Procedure(s) Performed: COLONOSCOPY WITH PROPOFOL (N/A ) POLYPECTOMY BIOPSY  Patient location during evaluation: Endoscopy Anesthesia Type: General Level of consciousness: awake and alert and oriented Pain management: pain level controlled Vital Signs Assessment: post-procedure vital signs reviewed and stable Respiratory status: spontaneous breathing, nonlabored ventilation and respiratory function stable Cardiovascular status: blood pressure returned to baseline and stable Postop Assessment: no apparent nausea or vomiting Anesthetic complications: no   No complications documented.   Last Vitals:  Vitals:   04/05/20 0844 04/05/20 1010  BP: 113/70 116/62  Pulse: 74 88  Resp: 14 (!) 23  Temp: 36.8 C 36.4 C  SpO2: 97% 98%    Last Pain:  Vitals:   04/05/20 1010  TempSrc: Oral  PainSc: 0-No pain                 Orlie Dakin

## 2020-04-05 NOTE — H&P (Addendum)
Primary Care Physician:  Loman Brooklyn, FNP Primary Gastroenterologist:  Dr. Abbey Chatters  Pre-Procedure History & Physical: HPI:  Tamara Mendoza is a 55 y.o. female is here for a colonoscopy for colon cancer screening purposes.  Patient denies any family history of colorectal cancer.  No melena or hematochezia.  No abdominal pain or unintentional weight loss.  No change in bowel habits.  Overall feels well from a GI standpoint.  Past Medical History:  Diagnosis Date  . Anxiety and depression   . Arthritis   . Eczema   . Mixed hyperlipidemia     Past Surgical History:  Procedure Laterality Date  . CESAREAN SECTION      Prior to Admission medications   Medication Sig Start Date End Date Taking? Authorizing Provider  Cholecalciferol (VITAMIN D3) 10 MCG (400 UNIT) tablet Take 400 Units by mouth daily.   Yes [provider]  escitalopram (LEXAPRO) 10 MG tablet Take 1 tablet (10 mg total) by mouth daily. 04/01/20  Yes Hendricks Limes F, FNP  meloxicam (MOBIC) 7.5 MG tablet Take 1 tablet (7.5 mg total) by mouth daily. 04/01/20  Yes Hendricks Limes F, FNP  triamcinolone cream (KENALOG) 0.1 % Apply 1 application topically 2 (two) times daily. Patient taking differently: Apply 1 application topically daily as needed (Rash). 11/13/19  Yes Hendricks Limes F, FNP  vitamin B-12 (CYANOCOBALAMIN) 500 MCG tablet Take 500 mcg by mouth daily.   Yes [provider]    Allergies as of 01/26/2020  . (No Known Allergies)    Family History  Problem Relation Age of Onset  . Hypertension Mother   . Uterine cancer Mother   . Cancer Father   . Diabetes Sister   . Diabetes Brother   . Diabetes Sister     Social History   Socioeconomic History  . Marital status: Single    Spouse name: Not on file  . Number of children: Not on file  . Years of education: Not on file  . Highest education level: Not on file  Occupational History  . Not on file  Tobacco Use  . Smoking status: Current  Every Day Smoker    Packs/day: 0.25    Years: 30.00    Pack years: 7.50    Types: Cigarettes  . Smokeless tobacco: Never Used  Vaping Use  . Vaping Use: Never used  Substance and Sexual Activity  . Alcohol use: Yes    Comment: occ  . Drug use: No  . Sexual activity: Not on file  Other Topics Concern  . Not on file  Social History Narrative  . Not on file   Social Determinants of Health   Financial Resource Strain: Not on file  Food Insecurity: Not on file  Transportation Needs: Not on file  Physical Activity: Not on file  Stress: Not on file  Social Connections: Not on file  Intimate Partner Violence: Not on file    Review of Systems: See HPI, otherwise negative ROS  PE  General:   Alert,  Well-developed, well-nourished, pleasant and cooperative in NAD Head:  Normocephalic and atraumatic. Eyes:  Sclera clear, no icterus.   Conjunctiva pink. Ears:  Normal auditory acuity. Nose:  No deformity, discharge,  or lesions. Mouth:  No deformity or lesions, dentition normal. Neck:  Supple; no masses or thyromegaly. Lungs:  Clear throughout to auscultation.   No wheezes, crackles, or rhonchi. No acute distress. Heart:  Regular rate and rhythm; no murmurs, clicks, rubs,  or gallops. Abdomen:  Soft, nontender and nondistended. No masses, hepatosplenomegaly or hernias noted. Normal bowel sounds, without guarding, and without rebound.   Msk:  Symmetrical without gross deformities. Normal posture. Extremities:  Without clubbing or edema. Neurologic:  Alert and  oriented x4;  grossly normal neurologically. Skin:  Intact without significant lesions or rashes. Cervical Nodes:  No significant cervical adenopathy. Psych:  Alert and cooperative. Normal mood and affect.  Impression/Plan: Tamara Mendoza is here for a colonoscopy to be performed for colon cancer screening purposes.  The risks of the procedure including infection, bleed, or perforation as well as benefits, limitations,  alternatives and imponderables have been reviewed with the patient. Questions have been answered. All parties agreeable.

## 2020-04-05 NOTE — Anesthesia Procedure Notes (Signed)
Date/Time: 04/05/2020 9:47 AM Performed by: Orlie Dakin, CRNA Pre-anesthesia Checklist: Patient identified, Emergency Drugs available, Suction available and Patient being monitored Patient Re-evaluated:Patient Re-evaluated prior to induction Oxygen Delivery Method: Nasal cannula Induction Type: IV induction Placement Confirmation: positive ETCO2

## 2020-04-05 NOTE — Op Note (Signed)
[facility] Patient Name: Voncille Simm Procedure Date: 04/05/2020 9:38 AM MRN: [id number] Date of Birth: [birth year] Attending MD: Elon Alas. Abbey Chatters DO CSN: [id number] Age: 55 Admit Type: Outpatient Procedure:                Colonoscopy Indications:              Screening for colorectal malignant neoplasm Providers:                Elon Alas. Abbey Chatters, DO, Caprice Kluver, Marvin                            Risa Grill, Technician Referring MD:              Medicines:                See the Anesthesia note for documentation of the                            administered medications Complications:            No immediate complications. Estimated Blood Loss:     Estimated blood loss was minimal. Procedure:                Pre-Anesthesia Assessment:                           - The anesthesia plan was to use monitored                            anesthesia care (MAC).                           After obtaining informed consent, the colonoscope                            was passed under direct vision. Throughout the                            procedure, the patient's blood pressure, pulse, and                            oxygen saturations were monitored continuously. The                            PCF-[id number] ([id number]) was introduced through the                            anus and advanced to the the cecum, identified by                            appendiceal orifice and ileocecal valve. The                            colonoscopy was performed without difficulty. The                            patient tolerated the procedure well. The  quality                            of the bowel preparation was evaluated using the                            BBPS [facility] Bowel Preparation Scale) with scores                            of: Right Colon = 3, Transverse Colon = 3 and Left                            Colon = 3 (entire mucosa seen well with no residual                            staining, small  fragments of stool or opaque                            liquid). The total BBPS score equals 9. Scope In: 9:46:58 AM Scope Out: 10:07:16 AM Scope Withdrawal Time: 0 hours 15 minutes 29 seconds  Total Procedure Duration: 0 hours 20 minutes 18 seconds  Findings:      The perianal and digital rectal examinations were normal.      Non-bleeding internal hemorrhoids were found during endoscopy.      A few small-mouthed diverticula were found in the sigmoid colon.      A 10 mm polyp was found in the sigmoid colon. The polyp was       pedunculated. The polyp was removed with a cold snare. Resection and       retrieval were complete.      Four sessile polyps were found in the transverse colon. The polyps were       4 to 7 mm in size. These polyps were removed with a cold snare.       Resection and retrieval were complete.      An area of nodular mucosa was found at the ileocecal valve. Biopsies       were taken with a cold forceps for histology. Impression:               - Non-bleeding internal hemorrhoids.                           - Diverticulosis in the sigmoid colon.                           - One 10 mm polyp in the sigmoid colon, removed                            with a cold snare. Resected and retrieved.                           - Four 4 to 7 mm polyps in the transverse colon,                            removed with a cold snare. Resected and retrieved.                           -  Nodular mucosa at the ileocecal valve. Biopsied. Moderate Sedation:      Per Anesthesia Care Recommendation:           - Patient has a contact number available for                            emergencies. The signs and symptoms of potential                            delayed complications were discussed with the                            patient. Return to normal activities tomorrow.                            Written discharge instructions were provided to the                            patient.                            - Resume previous diet.                           - Continue present medications.                           - Await pathology results.                           - Repeat colonoscopy in 3 years for surveillance.                           - Return to GI clinic PRN. Procedure Code(s):        --- Professional ---                           [phone], Colonoscopy, flexible; with removal of                            tumor(s), polyp(s), or other lesion(s) by snare                            technique Diagnosis Code(s):        --- Professional ---                           K63.5, Polyp of colon                           Z12.11, Encounter for screening for malignant                            neoplasm of colon                           K64.8, Other hemorrhoids  K57.30, Diverticulosis of large intestine without                            perforation or abscess without bleeding CPT copyright 2019 American Medical Association. All rights reserved. The codes documented in this report are preliminary and upon coder review may  be revised to meet current compliance requirements. Elon Alas. Abbey Chatters, DO [place] Abbey Chatters, DO 04/05/2020 10:10:37 AM This report has been signed electronically. Number of Addenda: 0

## 2020-04-05 NOTE — Anesthesia Preprocedure Evaluation (Signed)
Anesthesia Evaluation  Patient identified by MRN, date of birth, ID band Patient awake    Reviewed: Allergy & Precautions, H&P , NPO status , Patient's Chart, lab work & pertinent test results, reviewed documented beta blocker date and time   Airway Mallampati: II  TM Distance: >3 FB Neck ROM: full    Dental no notable dental hx.    Pulmonary neg pulmonary ROS, Current Smoker,    Pulmonary exam normal breath sounds clear to auscultation       Cardiovascular Exercise Tolerance: Good negative cardio ROS   Rhythm:regular Rate:Normal     Neuro/Psych PSYCHIATRIC DISORDERS Anxiety Depression negative neurological ROS     GI/Hepatic negative GI ROS, Neg liver ROS,   Endo/Other  negative endocrine ROS  Renal/GU negative Renal ROS  negative genitourinary   Musculoskeletal   Abdominal   Peds  Hematology negative hematology ROS (+)   Anesthesia Other Findings   Reproductive/Obstetrics negative OB ROS                             Anesthesia Physical Anesthesia Plan  ASA: II  Anesthesia Plan: General   Post-op Pain Management:    Induction:   PONV Risk Score and Plan: Propofol infusion  Airway Management Planned:   Additional Equipment:   Intra-op Plan:   Post-operative Plan:   Informed Consent: I have reviewed the patients History and Physical, chart, labs and discussed the procedure including the risks, benefits and alternatives for the proposed anesthesia with the patient or authorized representative who has indicated his/her understanding and acceptance.     Dental Advisory Given  Plan Discussed with: CRNA  Anesthesia Plan Comments:         Anesthesia Quick Evaluation

## 2020-04-05 NOTE — Discharge Instructions (Addendum)
Colonoscopy Discharge Instructions  Read the instructions outlined below and refer to this sheet in the next few weeks. These discharge instructions provide you with general information on caring for yourself after you leave the hospital. Your doctor may also give you specific instructions. While your treatment has been planned according to the most current medical practices available, unavoidable complications occasionally occur.   ACTIVITY  You may resume your regular activity, but move at a slower pace for the next 24 hours.   Take frequent rest periods for the next 24 hours.   Walking will help get rid of the air and reduce the bloated feeling in your belly (abdomen).   No driving for 24 hours (because of the medicine (anesthesia) used during the test).    Do not sign any important legal documents or operate any machinery for 24 hours (because of the anesthesia used during the test).  NUTRITION  Drink plenty of fluids.   You may resume your normal diet as instructed by your doctor.   Begin with a light meal and progress to your normal diet. Heavy or fried foods are harder to digest and may make you feel sick to your stomach (nauseated).   Avoid alcoholic beverages for 24 hours or as instructed.  MEDICATIONS  You may resume your normal medications unless your doctor tells you otherwise.  WHAT YOU CAN EXPECT TODAY  Some feelings of bloating in the abdomen.   Passage of more gas than usual.   Spotting of blood in your stool or on the toilet paper.  IF YOU HAD POLYPS REMOVED DURING THE COLONOSCOPY:  No aspirin products for 7 days or as instructed.   No alcohol for 7 days or as instructed.   Eat a soft diet for the next 24 hours.  FINDING OUT THE RESULTS OF YOUR TEST Not all test results are available during your visit. If your test results are not back during the visit, make an appointment with your caregiver to find out the results. Do not assume everything is normal if  you have not heard from your caregiver or the medical facility. It is important for you to follow up on all of your test results.  SEEK IMMEDIATE MEDICAL ATTENTION IF:  You have more than a spotting of blood in your stool.   Your belly is swollen (abdominal distention).   You are nauseated or vomiting.   You have a temperature over 101.   You have abdominal pain or discomfort that is severe or gets worse throughout the day.   Your colonoscopy revealed 5 polyp(s) which I removed successfully. Await pathology results, my office will contact you. I recommend repeating colonoscopy in 3 years for surveillance purposes.  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  I took biopsies of this as well.    Follow-up with GI as needed  I hope you have a great rest of your week!  Elon Alas. Abbey Chatters, D.O. Gastroenterology and Hepatology [facility]  \  Colon Polyps  Colon polyps are tissue growths inside the colon, which is part of the large intestine. They are one of the types of polyps that can grow in the body. A polyp may be a round bump or a mushroom-shaped growth. You could have one polyp or more than one. Most colon polyps are noncancerous (benign). However, some colon polyps can become cancerous over time. Finding and removing the polyps early can help prevent this. What are the causes?  The exact cause of colon polyps is not known. What increases the risk? The following factors may make you more likely to develop this condition:  Having a family history of colorectal cancer or colon polyps.  Being older than 56 years of age.  Being younger than 56 years of age and having a significant family history of colorectal cancer or colon polyps or a genetic condition that puts you at higher risk of getting colon polyps.  Having inflammatory bowel disease, such as ulcerative colitis or Crohn's disease.  Having certain conditions  passed from parent to child (hereditary conditions), such as: ? Familial adenomatous polyposis (FAP). ? Lynch syndrome. ? Turcot syndrome. ? Peutz-Jeghers syndrome. ? MUTYH-associated polyposis (MAP).  Being overweight.  Certain lifestyle factors. These include smoking cigarettes, drinking too much alcohol, not getting enough exercise, and eating a diet that is high in fat and red meat and low in fiber.  Having had childhood cancer that was treated with radiation of the abdomen. What are the signs or symptoms? Many times, there are no symptoms. If you have symptoms, they may include:  Blood coming from the rectum during a bowel movement.  Blood in the stool (feces). The blood may be bright red or very dark in color.  Pain in the abdomen.  A change in bowel habits, such as constipation or diarrhea. How is this diagnosed? This condition is diagnosed with a colonoscopy. This is a procedure in which a lighted, flexible scope is inserted into the opening between the buttocks (anus) and then passed into the colon to examine the area. Polyps are sometimes found when a colonoscopy is done as part of routine cancer screening tests. How is this treated? This condition is treated by removing any polyps that are found. Most polyps can be removed during a colonoscopy. Those polyps will then be tested for cancer. Additional treatment may be needed depending on the results of testing. Follow these instructions at home: Eating and drinking  Eat foods that are high in fiber, such as fruits, vegetables, and whole grains.  Eat foods that are high in calcium and vitamin D, such as milk, cheese, yogurt, eggs, liver, fish, and broccoli.  Limit foods that are high in fat, such as fried foods and desserts.  Limit the amount of red meat, precooked or cured meat, or other processed meat that you eat, such as hot dogs, sausages, bacon, or meat loaves.  Limit sugary drinks.   Lifestyle  Maintain a  healthy weight, or lose weight if recommended by your health care provider.  Exercise every day or as told by your health care provider.  Do not use any products that contain nicotine or tobacco, such as cigarettes, e-cigarettes, and chewing tobacco. If you need help quitting, ask your health care provider.  Do not drink alcohol if: ? Your health care provider tells you not to drink. ? You are pregnant, may be pregnant, or are planning to become pregnant.  If you drink alcohol: ? Limit how much you use to:  0-1 drink a day for women.  0-2 drinks a day for men. ? Know how much alcohol is in your drink. In the [place] one drink equals one 12 oz bottle of beer (355 mL), one 5 oz glass of wine (148 mL), or one 1 oz glass of hard liquor (44 mL). General instructions  Take over-the-counter and prescription medicines only as told by your health care provider.  Keep all follow-up visits. This is important. This includes  having regularly scheduled colonoscopies. Talk to your health care provider about when you need a colonoscopy. Contact a health care provider if:  You have new or worsening bleeding during a bowel movement.  You have new or increased blood in your stool.  You have a change in bowel habits.  You lose weight for no known reason. Summary  Colon polyps are tissue growths inside the colon, which is part of the large intestine. They are one type of polyp that can grow in the body.  Most colon polyps are noncancerous (benign), but some can become cancerous over time.  This condition is diagnosed with a colonoscopy.  This condition is treated by removing any polyps that are found. Most polyps can be removed during a colonoscopy. This information is not intended to replace advice given to you by your health care provider. Make sure you discuss any questions you have with your health care provider. Document Revised: 05/21/2019 Document Reviewed: 05/21/2019 Elsevier Patient  Education  2021 [place].   PATIENT INSTRUCTIONS POST-ANESTHESIA  IMMEDIATELY FOLLOWING SURGERY:  Do not drive or operate machinery for the first twenty four hours after surgery.  Do not make any important decisions for twenty four hours after surgery or while taking narcotic pain medications or sedatives.  If you develop intractable nausea and vomiting or a severe headache please notify your doctor immediately.  FOLLOW-UP:  Please make an appointment with your surgeon as instructed. You do not need to follow up with anesthesia unless specifically instructed to do so.  WOUND CARE INSTRUCTIONS (if applicable):  Keep a dry clean dressing on the anesthesia/puncture wound site if there is drainage.  Once the wound has quit draining you may leave it open to air.  Generally you should leave the bandage intact for twenty four hours unless there is drainage.  If the epidural site drains for more than 36-48 hours please call the anesthesia department.  QUESTIONS?:  Please feel free to call your physician or the hospital operator if you have any questions, and they will be happy to assist you.

## 2020-04-05 NOTE — Transfer of Care (Signed)
Immediate Anesthesia Transfer of Care Note  Patient: Tamara Mendoza  Procedure(s) Performed: COLONOSCOPY WITH PROPOFOL (N/A ) POLYPECTOMY BIOPSY  Patient Location: Endoscopy Unit  Anesthesia Type:General  Level of Consciousness: awake, alert  and oriented  Airway & Oxygen Therapy: Patient Spontanous Breathing  Post-op Assessment: Report given to RN and Post -op Vital signs reviewed and stable  Post vital signs: Reviewed and stable  Last Vitals:  Vitals Value Taken Time  BP    Temp    Pulse    Resp    SpO2      Last Pain:  Vitals:   04/05/20 0942  TempSrc:   PainSc: 0-No pain      Patients Stated Pain Goal: 1 (93/[id number]/88 [id number])  Complications: No complications documented.

## 2020-04-06 LAB — SURGICAL PATHOLOGY

## 2020-04-08 ENCOUNTER — Encounter (HOSPITAL_COMMUNITY): Payer: Self-pay | Admitting: Internal Medicine

## 2020-04-29 ENCOUNTER — Encounter: Payer: Self-pay | Admitting: Family Medicine

## 2020-04-29 ENCOUNTER — Ambulatory Visit (INDEPENDENT_AMBULATORY_CARE_PROVIDER_SITE_OTHER): Payer: BC Managed Care – PPO

## 2020-04-29 ENCOUNTER — Ambulatory Visit: Payer: BC Managed Care – PPO | Admitting: Family Medicine

## 2020-04-29 ENCOUNTER — Other Ambulatory Visit: Payer: Self-pay

## 2020-04-29 VITALS — BP 106/72 | HR 85 | Temp 98.1°F | Ht 62.0 in | Wt 190.5 lb

## 2020-04-29 DIAGNOSIS — M25561 Pain in right knee: Secondary | ICD-10-CM | POA: Diagnosis not present

## 2020-04-29 MED ORDER — PREDNISONE 10 MG (21) PO TBPK: ORAL_TABLET | ORAL | 0 refills | Status: DC

## 2020-04-29 NOTE — Patient Instructions (Signed)
Acute Knee Pain, Adult Acute knee pain is sudden and may be caused by damage, swelling, or irritation of the muscles and tissues that support the knee. Pain may result from:  A fall.  An injury to the knee from twisting motions.  A hit to the knee.  Infection. Acute knee pain may go away on its own with time and rest. If it does not, your health care provider may order tests to find the cause of the pain. These may include:  Imaging tests, such as an X-ray, MRI, CT scan, or ultrasound.  Joint aspiration. In this test, fluid is removed from the knee and evaluated.  Arthroscopy. In this test, a lighted tube is inserted into the knee and an image is projected onto a TV screen.  Biopsy. In this test, a sample of tissue is removed from the body and studied under a microscope. Follow these instructions at home: If you have a knee sleeve or brace:  Wear the knee sleeve or brace as told by your health care provider. Remove it only as told by your health care provider.  Loosen it if your toes tingle, become numb, or turn cold and blue.  Keep it clean.  If the knee sleeve or brace is not waterproof: ? Do not let it get wet. ? Cover it with a watertight covering when you take a bath or shower.   Activity  Rest your knee.  Do not do things that cause pain or make pain worse.  Avoid high-impact activities or exercises, such as running, jumping rope, or doing jumping jacks.  Work with a physical therapist to make a safe exercise program, as recommended by your health care provider. Do exercises as told by your physical therapist. Managing pain, stiffness, and swelling  If directed, put ice on the affected knee. To do this: ? If you have a removable knee sleeve or brace, remove it as told by your health care provider. ? Put ice in a plastic bag. ? Place a towel between your skin and the bag. ? Leave the ice on for 20 minutes, 2-3 times a day. ? Remove the ice if your skin turns bright  red. This is very important. If you cannot feel pain, heat, or cold, you have a greater risk of damage to the area.  If directed, use an elastic bandage to put pressure (compression) on your injured knee. This may control swelling, give support, and help with discomfort.  Raise (elevate) your knee above the level of your heart while you are sitting or lying down.  Sleep with a pillow under your knee.   General instructions  Take over-the-counter and prescription medicines only as told by your health care provider.  Do not use any products that contain nicotine or tobacco, such as cigarettes, e-cigarettes, and chewing tobacco. If you need help quitting, ask your health care provider.  If you are overweight, work with your health care provider and a dietitian to set a weight-loss goal that is healthy and reasonable for you. Extra weight can put pressure on your knee.  Pay attention to any changes in your symptoms.  Keep all follow-up visits. This is important. Contact a health care provider if:  Your knee pain continues, changes, or gets worse.  You have a fever along with knee pain.  Your knee feels warm to the touch or is red.  Your knee buckles or locks up. Get help right away if:  Your knee swells, and the swelling becomes   worse.  You cannot move your knee.  You have severe pain in your knee that cannot be managed with pain medicine. Summary  Acute knee pain can be caused by a fall, an injury, an infection, or damage, swelling, or irritation of the tissues that support your knee.  Your health care provider may perform tests to find out the cause of the pain.  Pay attention to any changes in your symptoms. Relieve your pain with rest, medicines, light activity, and the use of ice.  Get help right away if your knee swells, you cannot move your knee, or you have severe pain that cannot be managed with medicine. This information is not intended to replace advice given to you  by your health care provider. Make sure you discuss any questions you have with your health care provider. Document Revised: 07/16/2019 Document Reviewed: 07/16/2019 Elsevier Patient Education  2021 [employer]vier Inc[employer]  

## 2020-04-29 NOTE — Progress Notes (Signed)
Acute Office Visit  Subjective:    Patient ID: Tamara Mendoza, female    DOB: 05-Mar-1965, 55 y.o.   MRN: [id number]  Chief Complaint  Patient presents with  . Knee Pain    HPI Patient is in today for acute right knee pain x 6 days. The pain started after she was walking fast and heard a "crack" noise with sharp pain running up her thigh. The pain is intermittent and is both achy and sharp at times. The pain radiates from her knee up  Her thigh. The pain is moderate. She has been taking ibuprofen, ice, and a knee brace. She has tried mobic as well with some improvement. She denies numbness, tingling, erythema, warmth, or swelling. She has not had any more popping, clicking, or catching. Overall the pain has improved a little.   Past Medical History:  Diagnosis Date  . Anxiety and depression   . Arthritis   . Eczema   . Mixed hyperlipidemia     Past Surgical History:  Procedure Laterality Date  . BIOPSY  04/05/2020   Procedure: BIOPSY;  Surgeon: Eloise Harman, DO;  Location: [facility];  Service: Endoscopy;;  . CESAREAN SECTION    . COLONOSCOPY WITH PROPOFOL N/A 04/05/2020   Procedure: COLONOSCOPY WITH PROPOFOL;  Surgeon: Eloise Harman, DO;  Location: [facility];  Service: Endoscopy;  Laterality: N/A;  11:00 ASA I/II, pt tested + 1/26  . POLYPECTOMY  04/05/2020   Procedure: POLYPECTOMY;  Surgeon: Eloise Harman, DO;  Location: [facility];  Service: Endoscopy;;    Family History  Problem Relation Age of Onset  . Hypertension Mother   . Uterine cancer Mother   . Cancer Father   . Diabetes Sister   . Diabetes Brother   . Diabetes Sister     Social History   Socioeconomic History  . Marital status: Single    Spouse name: Not on file  . Number of children: Not on file  . Years of education: Not on file  . Highest education level: Not on file  Occupational History  . Not on file  Tobacco Use  . Smoking status: Current Every Day Smoker    Packs/day: 0.25     Years: 30.00    Pack years: 7.50    Types: Cigarettes  . Smokeless tobacco: Never Used  Vaping Use  . Vaping Use: Never used  Substance and Sexual Activity  . Alcohol use: Yes    Comment: occ  . Drug use: No  . Sexual activity: Not on file  Other Topics Concern  . Not on file  Social History Narrative  . Not on file   Social Determinants of Health   Financial Resource Strain: Not on file  Food Insecurity: Not on file  Transportation Needs: Not on file  Physical Activity: Not on file  Stress: Not on file  Social Connections: Not on file  Intimate Partner Violence: Not on file    Outpatient Medications Prior to Visit  Medication Sig Dispense Refill  . Cholecalciferol (VITAMIN D3) 10 MCG (400 UNIT) tablet Take 400 Units by mouth daily.    Marland Kitchen escitalopram (LEXAPRO) 10 MG tablet Take 1 tablet (10 mg total) by mouth daily. 90 tablet 1  . ibuprofen (ADVIL) 600 MG tablet Take 600 mg by mouth every 8 (eight) hours as needed.    . meloxicam (MOBIC) 7.5 MG tablet Take 1 tablet (7.5 mg total) by mouth daily. 30 tablet 5  .  triamcinolone cream (KENALOG) 0.1 % Apply 1 application topically 2 (two) times daily. (Patient taking differently: Apply 1 application topically daily as needed (Rash).) 80 g 2  . vitamin B-12 (CYANOCOBALAMIN) 500 MCG tablet Take 500 mcg by mouth daily.     No facility-administered medications prior to visit.    No Known Allergies  Review of Systems As per HPI.     Objective:    Physical Exam Vitals and nursing note reviewed.  Constitutional:      General: She is not in acute distress.    Appearance: Normal appearance. She is not ill-appearing, toxic-appearing or diaphoretic.  Pulmonary:     Effort: Pulmonary effort is normal. No respiratory distress.  Musculoskeletal:     Lumbar back: No tenderness or bony tenderness. Negative right straight leg raise test and negative left straight leg raise test.     Right knee: No swelling, deformity, effusion,  erythema, bony tenderness or crepitus. Normal range of motion. No tenderness. Normal alignment, normal meniscus and normal patellar mobility.     Instability Tests: Medial McMurray test negative and lateral McMurray test negative.     Right lower leg: No edema.  Skin:    General: Skin is warm and dry.  Neurological:     General: No focal deficit present.     Mental Status: She is alert and oriented to person, place, and time.  Psychiatric:        Mood and Affect: Mood normal.        Behavior: Behavior normal.        Thought Content: Thought content normal.        Judgment: Judgment normal.     BP 106/72   Pulse 85   Temp 98.1 F (36.7 C) (Temporal)   Ht 5\' 2"  (1.575 m)   Wt 190 lb 8 oz (86.4 kg)   BMI 34.84 kg/m  Wt Readings from Last 3 Encounters:  04/29/20 190 lb 8 oz (86.4 kg)  04/01/20 185 lb 3.2 oz (84 kg)  01/22/20 190 lb 6.4 oz (86.4 kg)    Health Maintenance Due  Topic Date Due  . COVID-19 Vaccine (1) Never done    There are no preventive care reminders to display for this patient.   Lab Results  Component Value Date   TSH 1.580 11/13/2019   Lab Results  Component Value Date   WBC 6.4 11/13/2019   HGB 14.2 11/13/2019   HCT 43.2 11/13/2019   MCV 86 11/13/2019   PLT 268 11/13/2019   Lab Results  Component Value Date   NA 139 11/13/2019   K 4.2 11/13/2019   CO2 24 11/13/2019   GLUCOSE 88 11/13/2019   BUN 11 11/13/2019   CREATININE 0.61 11/13/2019   BILITOT 0.4 11/13/2019   ALKPHOS 92 11/13/2019   AST 17 11/13/2019   ALT 17 11/13/2019   PROT 6.5 11/13/2019   ALBUMIN 4.3 11/13/2019   CALCIUM 9.1 11/13/2019   Lab Results  Component Value Date   CHOL 223 (H) 11/13/2019   Lab Results  Component Value Date   HDL 38 (L) 11/13/2019   Lab Results  Component Value Date   LDLCALC 155 (H) 11/13/2019   Lab Results  Component Value Date   TRIG 163 (H) 11/13/2019   Lab Results  Component Value Date   CHOLHDL 5.9 (H) 11/13/2019   No results  found for: HGBA1C     Assessment & Plan:   Kaytelynn was seen today for knee pain.  Diagnoses  and all orders for this visit:  Acute pain of right knee Xray today in office, radiology report pending. Prednisone taper ordered. Continue mobic, ice, heat, rest, brace. Return to office for new or worsening symptoms, or if symptoms persist.  -     DG Knee 1-2 Views Right; Future -     predniSONE (STERAPRED UNI-PAK 21 TAB) 10 MG (21) TBPK tablet; Use as directed on back of pill pack  The patient indicates understanding of these issues and agrees with the plan.  Gwenlyn Perking, FNP

## 2020-05-03 ENCOUNTER — Telehealth: Payer: Self-pay

## 2020-05-03 NOTE — Telephone Encounter (Signed)
-----   Message from Irving Copas., MD sent at 05/01/2020  1:40 AM EDT ----- Lupita Leash, Happy to be available. We can get set up for procedure based on my scheduling will probably be 6 to 10 weeks out but certainly happy to have an opportunity to see if we can try to take care of this endoscopically rather than surgically.    If the patient is amenable, reply back and Chong Sicilian can work on getting scheduled. Thanks. GM ----- Message ----- From: Eloise Harman, DO Sent: 04/28/2020   1:07 PM EDT To: Irving Copas., MD  GM,   This is another patient I wanted to discuss with you.  I performed colonoscopy on her last month with numerous polyps removed.  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  I biopsied this which did come back positive for sessile serrated polyps.  Did not appear that it extended into the IC valve itself that I could tell.  This would likely be a softball EMR for you compared to the pictures you have showed me of previous polyps removed.  Can I set her up for colonoscopy with EMR with you?  Thanks, [employer]

## 2020-05-04 ENCOUNTER — Other Ambulatory Visit: Payer: Self-pay

## 2020-05-04 DIAGNOSIS — Z86010 Personal history of colonic polyps: Secondary | ICD-10-CM

## 2020-05-04 MED ORDER — PEG 3350-KCL-NA BICARB-NACL 420 G PO SOLR: 4000.0000 mL | Freq: Once | ORAL | 0 refills | Status: AC

## 2020-05-04 NOTE — Telephone Encounter (Signed)
Colon EMR scheduled with Dr Rush Landmark at [facility] on 06/21/20

## 2020-05-04 NOTE — Telephone Encounter (Signed)
COVID test on 06/17/20 at 1005 am  Colon scheduled, pt instructed and medications reviewed.  Patient instructions mailed to home.  Patient to call with any questions or concerns.

## 2020-06-02 ENCOUNTER — Encounter: Payer: Self-pay | Admitting: Family Medicine

## 2020-06-02 ENCOUNTER — Other Ambulatory Visit: Payer: Self-pay

## 2020-06-02 ENCOUNTER — Ambulatory Visit (INDEPENDENT_AMBULATORY_CARE_PROVIDER_SITE_OTHER): Payer: BC Managed Care – PPO | Admitting: Family Medicine

## 2020-06-02 VITALS — BP 129/88 | HR 105 | Temp 98.3°F | Ht 62.0 in | Wt 192.2 lb

## 2020-06-02 DIAGNOSIS — M170 Bilateral primary osteoarthritis of knee: Secondary | ICD-10-CM | POA: Diagnosis not present

## 2020-06-02 DIAGNOSIS — M1990 Unspecified osteoarthritis, unspecified site: Secondary | ICD-10-CM | POA: Diagnosis not present

## 2020-06-02 NOTE — Progress Notes (Signed)
Assessment & Plan:  1. Arthritis Meloxicam increased from 7.5 mg to 15 mg once daily.  Encouraged use of Voltaren gel. - meloxicam (MOBIC) 15 MG tablet; Take 1 tablet (15 mg total) by mouth daily.  Dispense: 30 tablet; Refill: 2  2. Bilateral primary osteoarthritis of knee Referring to orthopedics for possible viscosupplementation. - Ambulatory referral to Orthopedic Surgery   Return if symptoms worsen or fail to improve.  Hendricks Limes, MSN, APRN, FNP-C [facility]  Subjective:    Patient ID: Tamara Mendoza, female    DOB: [birth year], 55 y.o.   MRN: [id number]  Patient Care Team: Loman Brooklyn, FNP as PCP - General (Family Medicine) Eloise Harman, DO as Consulting Physician (Internal Medicine)   Chief Complaint:  Chief Complaint  Patient presents with  . Knee Pain    Patient states she has been having ongoing bilateral knee pain that has gotten worse. Patient states that right knee pain is worse and has shooting pain up leg.     HPI: Tamara Mendoza is a 55 y.o. female presenting on 06/02/2020 for Knee Pain (Patient states she has been having ongoing bilateral knee pain that has gotten worse. Patient states that right knee pain is worse and has shooting pain up leg. )  Patient has a chronic pain of both knees.  She was previously taking ibuprofen for pain.  She was given meloxicam 7.5 mg once daily approximately 2 months ago.  She has had 2 rounds of oral steroids due to the pain in her knees.  X-ray of her left knee shows tricompartment degenerative change with degenerative changes most prominent about the left medial compartment.  X-ray of the right knee shows osteophytes in the lateral patellofemoral compartments consistent with degenerative change.  She also has loss of joint space in the medial compartment, particularly with standing.  She does work on her feet all day.  She reports the pain in her right knee is worse than the left.  She has been  trying to wear a knee brace.  She has a knee pad that she uses when she has to get down on her knees at work.   Social history:  Relevant past medical, surgical, family and social history reviewed and updated as indicated. Interim medical history since our last visit reviewed.  Allergies and medications reviewed and updated.  DATA REVIEWED: CHART IN EPIC  ROS: Negative unless specifically indicated above in HPI.    Current Outpatient Medications:  .  Cholecalciferol (VITAMIN D3) 10 MCG (400 UNIT) tablet, Take 400 Units by mouth daily., Disp: , Rfl:  .  escitalopram (LEXAPRO) 10 MG tablet, Take 1 tablet (10 mg total) by mouth daily., Disp: 90 tablet, Rfl: 1 .  ibuprofen (ADVIL) 600 MG tablet, Take 600 mg by mouth every 8 (eight) hours as needed., Disp: , Rfl:  .  triamcinolone cream (KENALOG) 0.1 %, Apply 1 application topically 2 (two) times daily. (Patient taking differently: Apply 1 application topically daily as needed (Rash).), Disp: 80 g, Rfl: 2 .  vitamin B-12 (CYANOCOBALAMIN) 500 MCG tablet, Take 500 mcg by mouth daily., Disp: , Rfl:    No Known Allergies Past Medical History:  Diagnosis Date  . Anxiety and depression   . Arthritis   . Eczema   . Mixed hyperlipidemia     Past Surgical History:  Procedure Laterality Date  . BIOPSY  04/05/2020   Procedure: BIOPSY;  Surgeon: Eloise Harman, DO;  [facility];  Service: Endoscopy;;  . CESAREAN SECTION    . COLONOSCOPY WITH PROPOFOL N/A 04/05/2020   Procedure: COLONOSCOPY WITH PROPOFOL;  Surgeon: Eloise Harman, DO;  Location: [facility];  Service: Endoscopy;  Laterality: N/A;  11:00 ASA I/II, pt tested + 1/26  . POLYPECTOMY  04/05/2020   Procedure: POLYPECTOMY;  Surgeon: Eloise Harman, DO;  Location: [facility];  Service: Endoscopy;;    Social History   Socioeconomic History  . Marital status: Single    Spouse name: Not on file  . Number of children: Not on file  . Years of education: Not on  file  . Highest education level: Not on file  Occupational History  . Not on file  Tobacco Use  . Smoking status: Current Every Day Smoker    Packs/day: 0.25    Years: 30.00    Pack years: 7.50    Types: Cigarettes  . Smokeless tobacco: Never Used  Vaping Use  . Vaping Use: Never used  Substance and Sexual Activity  . Alcohol use: Yes    Comment: occ  . Drug use: No  . Sexual activity: Not on file  Other Topics Concern  . Not on file  Social History Narrative  . Not on file   Social Determinants of Health   Financial Resource Strain: Not on file  Food Insecurity: Not on file  Transportation Needs: Not on file  Physical Activity: Not on file  Stress: Not on file  Social Connections: Not on file  Intimate Partner Violence: Not on file        Objective:    BP 129/88   Pulse (!) 105   Temp 98.3 F (36.8 C) (Temporal)   Ht 5\' 2"  (1.575 m)   Wt 192 lb 3.2 oz (87.2 kg)   SpO2 96%   BMI 35.15 kg/m   Wt Readings from Last 3 Encounters:  06/02/20 192 lb 3.2 oz (87.2 kg)  04/29/20 190 lb 8 oz (86.4 kg)  04/01/20 185 lb 3.2 oz (84 kg)    Physical Exam Vitals reviewed.  Constitutional:      General: She is not in acute distress.    Appearance: Normal appearance. She is morbidly obese. She is not ill-appearing, toxic-appearing or diaphoretic.  HENT:     Head: Normocephalic and atraumatic.  Eyes:     General: No scleral icterus.       Right eye: No discharge.        Left eye: No discharge.     Conjunctiva/sclera: Conjunctivae normal.  Cardiovascular:     Rate and Rhythm: Normal rate.  Pulmonary:     Effort: Pulmonary effort is normal. No respiratory distress.  Musculoskeletal:        General: Normal range of motion.     Cervical back: Normal range of motion.  Skin:    General: Skin is warm and dry.     Capillary Refill: Capillary refill takes less than 2 seconds.  Neurological:     General: No focal deficit present.     Mental Status: She is alert and  oriented to person, place, and time. Mental status is at baseline.  Psychiatric:        Mood and Affect: Mood normal.        Behavior: Behavior normal.        Thought Content: Thought content normal.        Judgment: Judgment normal.     Lab Results  Component Value Date  TSH 1.580 11/13/2019   Lab Results  Component Value Date   WBC 6.4 11/13/2019   HGB 14.2 11/13/2019   HCT 43.2 11/13/2019   MCV 86 11/13/2019   PLT 268 11/13/2019   Lab Results  Component Value Date   NA 139 11/13/2019   K 4.2 11/13/2019   CO2 24 11/13/2019   GLUCOSE 88 11/13/2019   BUN 11 11/13/2019   CREATININE 0.61 11/13/2019   BILITOT 0.4 11/13/2019   ALKPHOS 92 11/13/2019   AST 17 11/13/2019   ALT 17 11/13/2019   PROT 6.5 11/13/2019   ALBUMIN 4.3 11/13/2019   CALCIUM 9.1 11/13/2019   Lab Results  Component Value Date   CHOL 223 (H) 11/13/2019   Lab Results  Component Value Date   HDL 38 (L) 11/13/2019   Lab Results  Component Value Date   LDLCALC 155 (H) 11/13/2019   Lab Results  Component Value Date   TRIG 163 (H) 11/13/2019   Lab Results  Component Value Date   CHOLHDL 5.9 (H) 11/13/2019   No results found for: HGBA1C

## 2020-06-03 MED ORDER — MELOXICAM 15 MG PO TABS: 15.0000 mg | ORAL_TABLET | Freq: Every day | ORAL | 2 refills | Status: DC

## 2020-06-06 ENCOUNTER — Encounter: Payer: Self-pay | Admitting: Family Medicine

## 2020-06-10 ENCOUNTER — Ambulatory Visit (INDEPENDENT_AMBULATORY_CARE_PROVIDER_SITE_OTHER): Payer: BC Managed Care – PPO | Admitting: Orthopaedic Surgery

## 2020-06-10 ENCOUNTER — Other Ambulatory Visit: Payer: Self-pay

## 2020-06-10 DIAGNOSIS — M1711 Unilateral primary osteoarthritis, right knee: Secondary | ICD-10-CM | POA: Diagnosis not present

## 2020-06-10 DIAGNOSIS — M179 Osteoarthritis of knee, unspecified: Secondary | ICD-10-CM | POA: Insufficient documentation

## 2020-06-10 NOTE — Progress Notes (Signed)
Office Visit Note   Patient: Tamara Mendoza           Date of Birth: [birth year]           MRN: [id number] Visit Date: 06/10/2020              Requested by: Loman Brooklyn, [place],  [place] [address] PCP: Loman Brooklyn, FNP   Assessment & Plan: Visit Diagnoses:  1. Unilateral primary osteoarthritis, right knee     Plan: Cortisone injection performed right knee which she tolerated well some improvement postinjection we will see how she does and recheck her in 5 weeks.  She was having more symptoms right knee than left knee.  Plain radiographs actually shows slightly more medial joint line narrowing on the left knee than right knee.  Follow-Up Instructions: Return in about 5 weeks (around 07/15/2020).   Orders:  No orders of the defined types were placed in this encounter.  No orders of the defined types were placed in this encounter.     Procedures: No procedures performed   Clinical Data: No additional findings.   Subjective: Chief Complaint  Patient presents with  . Right Knee - Pain    HPI 55 year old female works at [employer] with bilateral knee pain worse on the right than left.  Pain seems to radiate into the mid quad at times she has had some catching in her knee no true locking.  She occasionally has some swelling.  No numbness or tingling in her feet she uses knee sleeves.  She is a 1/4 pack/day smoker. She takes medicine for depression also has been on meloxicam and also ibuprofen before that. Review of Systems all other systems noncontributory to HPI.   Objective: Vital Signs: There were no vitals taken for this visit.  Physical Exam Constitutional:      Appearance: She is well-developed.  HENT:     Head: Normocephalic.     Right Ear: External ear normal.     Left Ear: External ear normal.  Eyes:     Pupils: Pupils are equal, round, and reactive to light.  Neck:     Thyroid: No thyromegaly.     Trachea: No tracheal deviation.   Cardiovascular:     Rate and Rhythm: Normal rate.  Pulmonary:     Effort: Pulmonary effort is normal.  Abdominal:     Palpations: Abdomen is soft.  Skin:    General: Skin is warm and dry.  Neurological:     Mental Status: She is alert and oriented to person, place, and time.  Psychiatric:        Behavior: Behavior normal.     Ortho Exam mild crepitus with knee range of motion more tenderness medial joint line right knee than left knee.  Negative logroll of the hips negative straight leg raising negative popliteal compression test.  No lower extremity atrophy normal pulses.  Specialty Comments:  No specialty comments available.  Imaging: Narrative & Impression  CLINICAL DATA:  Acute right knee pain  EXAM: RIGHT KNEE - 1-2 VIEW  COMPARISON:  None.  FINDINGS: There are degenerative changes in the lateral and patellofemoral compartments. There is loss of joint space in the medial compartment, particularly with standing. No other acute abnormalities.  IMPRESSION: Osteophytes in the lateral patellofemoral compartments consistent with degenerative change. Loss of joint space in the medial compartment, particularly with standing.   Electronically Signed   By: Dorise Bullion III M.D  On: 05/01/2020 21:17       PMFS History: Patient Active Problem List   Diagnosis Date Noted  . Unilateral primary osteoarthritis, right knee 06/10/2020  . Arthritis 04/01/2020  . Mixed hyperlipidemia   . Difficulty sleeping 11/16/2019  . Hot flashes 11/16/2019  . Eczema of both hands 11/16/2019  . Tobacco use 11/13/2019  . Anxiety and depression 10/02/2019   Past Medical History:  Diagnosis Date  . Anxiety and depression   . Arthritis   . Eczema   . Mixed hyperlipidemia     Family History  Problem Relation Age of Onset  . Hypertension Mother   . Uterine cancer Mother   . Cancer Father   . Diabetes Sister   . Diabetes Brother   . Diabetes Sister     Past  Surgical History:  Procedure Laterality Date  . BIOPSY  04/05/2020   Procedure: BIOPSY;  Surgeon: Eloise Harman, DO;  [facility];  Service: Endoscopy;;  . CESAREAN SECTION    . COLONOSCOPY WITH PROPOFOL N/A 04/05/2020   Procedure: COLONOSCOPY WITH PROPOFOL;  Surgeon: Eloise Harman, DO;  [facility];  Service: Endoscopy;  Laterality: N/A;  11:00 ASA I/II, pt tested + 1/26  . POLYPECTOMY  04/05/2020   Procedure: POLYPECTOMY;  Surgeon: Eloise Harman, DO;  [facility];  Service: Endoscopy;;   Social History   Occupational History  . Not on file  Tobacco Use  . Smoking status: Current Every Day Smoker    Packs/day: 0.25    Years: 30.00    Pack years: 7.50    Types: Cigarettes  . Smokeless tobacco: Never Used  Vaping Use  . Vaping Use: Never used  Substance and Sexual Activity  . Alcohol use: Yes    Comment: occ  . Drug use: No  . Sexual activity: Not on file

## 2020-06-16 ENCOUNTER — Other Ambulatory Visit: Payer: Self-pay

## 2020-06-17 ENCOUNTER — Other Ambulatory Visit (HOSPITAL_COMMUNITY)
Admission: RE | Admit: 2020-06-17 | Discharge: 2020-06-17 | Disposition: A | Discharge status: Home / Self Care | Payer: BC Managed Care – PPO | Source: Ambulatory Visit | Attending: Gastroenterology | Admitting: Gastroenterology

## 2020-06-17 DIAGNOSIS — Z01812 Encounter for preprocedural laboratory examination: Secondary | ICD-10-CM | POA: Insufficient documentation

## 2020-06-17 DIAGNOSIS — Z20822 Contact with and (suspected) exposure to covid-19: Secondary | ICD-10-CM | POA: Insufficient documentation

## 2020-06-18 LAB — SARS CORONAVIRUS 2 (TAT 6-24 HRS): SARS Coronavirus 2: NEGATIVE

## 2020-06-21 ENCOUNTER — Ambulatory Visit (HOSPITAL_COMMUNITY): Payer: BC Managed Care – PPO | Admitting: Anesthesiology

## 2020-06-21 ENCOUNTER — Encounter (HOSPITAL_COMMUNITY): Payer: Self-pay | Admitting: Gastroenterology

## 2020-06-21 ENCOUNTER — Encounter (HOSPITAL_COMMUNITY)
Admission: RE | Disposition: A | Discharge status: Home / Self Care | Payer: Self-pay | Source: Home / Self Care | Attending: Gastroenterology

## 2020-06-21 ENCOUNTER — Ambulatory Visit (HOSPITAL_COMMUNITY)
Admission: RE | Admit: 2020-06-21 | Discharge: 2020-06-21 | Disposition: A | Discharge status: Home / Self Care | Payer: BC Managed Care – PPO | Attending: Gastroenterology | Admitting: Gastroenterology

## 2020-06-21 ENCOUNTER — Other Ambulatory Visit: Payer: Self-pay

## 2020-06-21 DIAGNOSIS — K642 Third degree hemorrhoids: Secondary | ICD-10-CM | POA: Insufficient documentation

## 2020-06-21 DIAGNOSIS — D120 Benign neoplasm of cecum: Secondary | ICD-10-CM

## 2020-06-21 DIAGNOSIS — D125 Benign neoplasm of sigmoid colon: Secondary | ICD-10-CM | POA: Diagnosis not present

## 2020-06-21 DIAGNOSIS — D123 Benign neoplasm of transverse colon: Secondary | ICD-10-CM | POA: Diagnosis not present

## 2020-06-21 DIAGNOSIS — Z8249 Family history of ischemic heart disease and other diseases of the circulatory system: Secondary | ICD-10-CM | POA: Insufficient documentation

## 2020-06-21 DIAGNOSIS — K635 Polyp of colon: Secondary | ICD-10-CM | POA: Diagnosis present

## 2020-06-21 DIAGNOSIS — F17210 Nicotine dependence, cigarettes, uncomplicated: Secondary | ICD-10-CM | POA: Diagnosis not present

## 2020-06-21 DIAGNOSIS — E782 Mixed hyperlipidemia: Secondary | ICD-10-CM | POA: Insufficient documentation

## 2020-06-21 DIAGNOSIS — D122 Benign neoplasm of ascending colon: Secondary | ICD-10-CM | POA: Insufficient documentation

## 2020-06-21 DIAGNOSIS — D124 Benign neoplasm of descending colon: Secondary | ICD-10-CM

## 2020-06-21 DIAGNOSIS — Z8049 Family history of malignant neoplasm of other genital organs: Secondary | ICD-10-CM | POA: Diagnosis not present

## 2020-06-21 DIAGNOSIS — Z809 Family history of malignant neoplasm, unspecified: Secondary | ICD-10-CM | POA: Insufficient documentation

## 2020-06-21 DIAGNOSIS — K644 Residual hemorrhoidal skin tags: Secondary | ICD-10-CM | POA: Insufficient documentation

## 2020-06-21 DIAGNOSIS — Z86010 Personal history of colonic polyps: Secondary | ICD-10-CM

## 2020-06-21 HISTORY — PX: ENDOSCOPIC MUCOSAL RESECTION: SHX6839

## 2020-06-21 HISTORY — PX: BIOPSY: SHX5522

## 2020-06-21 HISTORY — PX: HEMOSTASIS CLIP PLACEMENT: SHX6857

## 2020-06-21 HISTORY — PX: POLYPECTOMY: SHX5525

## 2020-06-21 HISTORY — PX: COLONOSCOPY WITH PROPOFOL: SHX5780

## 2020-06-21 SURGERY — COLONOSCOPY WITH PROPOFOL
Anesthesia: Monitor Anesthesia Care

## 2020-06-21 MED ORDER — PROPOFOL 10 MG/ML IV BOLUS
INTRAVENOUS | Status: DC | PRN
  Administered 2020-06-21 (×5): 20 mg via INTRAVENOUS

## 2020-06-21 MED ORDER — PROPOFOL 10 MG/ML IV BOLUS
INTRAVENOUS | Status: AC
  Filled 2020-06-21: qty 20

## 2020-06-21 MED ORDER — PROPOFOL 500 MG/50ML IV EMUL
INTRAVENOUS | Status: DC | PRN
  Administered 2020-06-21: 125 ug/kg/min via INTRAVENOUS

## 2020-06-21 MED ORDER — LIDOCAINE 2% (20 MG/ML) 5 ML SYRINGE
INTRAMUSCULAR | Status: DC | PRN
  Administered 2020-06-21: 100 mg via INTRAVENOUS

## 2020-06-21 MED ORDER — LACTATED RINGERS IV SOLN: INTRAVENOUS | Status: DC | PRN

## 2020-06-21 MED ORDER — SODIUM CHLORIDE 0.9 % IV SOLN: INTRAVENOUS | Status: DC

## 2020-06-21 MED ORDER — PROPOFOL 500 MG/50ML IV EMUL
INTRAVENOUS | Status: AC
  Filled 2020-06-21: qty 50

## 2020-06-21 SURGICAL SUPPLY — 21 items

## 2020-06-21 NOTE — Anesthesia Preprocedure Evaluation (Addendum)
Anesthesia Evaluation  Patient identified by MRN, date of birth, ID band Patient awake    Reviewed: Allergy & Precautions, NPO status , Patient's Chart, lab work & pertinent test results  Airway Mallampati: I  TM Distance: >3 FB Neck ROM: Full    Dental  (+) Edentulous Upper, Edentulous Lower   Pulmonary Current Smoker and Patient abstained from smoking.,    Pulmonary exam normal        Cardiovascular negative cardio ROS   Rhythm:Regular Rate:Normal     Neuro/Psych PSYCHIATRIC DISORDERS Anxiety Depression    GI/Hepatic negative GI ROS, Neg liver ROS,   Endo/Other  negative endocrine ROS  Renal/GU negative Renal ROS     Musculoskeletal  (+) Arthritis ,   Abdominal Normal abdominal exam  (+)   Peds  Hematology negative hematology ROS (+)   Anesthesia Other Findings   Reproductive/Obstetrics                            Anesthesia Physical Anesthesia Plan  ASA: II  Anesthesia Plan: MAC   Post-op Pain Management:    Induction: Intravenous  PONV Risk Score and Plan: 0 and Propofol infusion  Airway Management Planned: Natural Airway and Simple Face Mask  Additional Equipment: None  Intra-op Plan:   Post-operative Plan:   Informed Consent: I have reviewed the patients History and Physical, chart, labs and discussed the procedure including the risks, benefits and alternatives for the proposed anesthesia with the patient or authorized representative who has indicated his/her understanding and acceptance.       Plan Discussed with: CRNA  Anesthesia Plan Comments:        Anesthesia Quick Evaluation

## 2020-06-21 NOTE — Op Note (Signed)
[facility] Patient Name: Tamara Mendoza Procedure Date: 06/21/2020 MRN: [id number] Attending MD: Justice Britain , MD Date of Birth: [birth year] CSN: [id number] Age: 55 Admit Type: Outpatient Procedure:                Colonoscopy Indications:              Excision of colonic polyp Providers:                Justice Britain, MD, Baird Cancer, RN, Laverda Sorenson, Technician, Danley Danker, CRNA Referring MD:             Loman Brooklyn, Elon Alas. Abbey Chatters, DO Medicines:                Monitored Anesthesia Care Complications:            No immediate complications. Estimated Blood Loss:     Estimated blood loss was minimal. Procedure:                Pre-Anesthesia Assessment:                           - Prior to the procedure, a History and Physical                            was performed, and patient medications and                            allergies were reviewed. The patient's tolerance of                            previous anesthesia was also reviewed. The risks                            and benefits of the procedure and the sedation                            options and risks were discussed with the patient.                            All questions were answered, and informed consent                            was obtained. Prior Anticoagulants: The patient has                            taken no previous anticoagulant or antiplatelet                            agents except for NSAID medication. ASA Grade                            Assessment: II - A patient with mild systemic  disease. After reviewing the risks and benefits,                            the patient was deemed in satisfactory condition to                            undergo the procedure.                           After obtaining informed consent, the colonoscope                            was passed under direct vision. Throughout the                             procedure, the patient's blood pressure, pulse, and                            oxygen saturations were monitored continuously. The                            CF-HQ190L ([id number] Olympus colonoscope was                            introduced through the anus and advanced to the 5                            cm into the ileum. The colonoscopy was performed                            without difficulty. The patient tolerated the                            procedure. The quality of the bowel preparation was                            adequate. The terminal ileum, ileocecal valve,                            appendiceal orifice, and rectum were photographed. Scope In: 10:04:33 AM Scope Out: 10:54:58 AM Scope Withdrawal Time: 0 hours 47 minutes 26 seconds  Total Procedure Duration: 0 hours 50 minutes 25 seconds  Findings:      The digital rectal exam findings include hemorrhoids. Pertinent       negatives include no palpable rectal lesions.      The terminal ileum and ileocecal valve appeared normal.      A 20 mm polyp was found in the ileocecal valve. The polyp was sessile.       Preparations were made for mucosal resection. NBI imaging and       White-light endoscopy was done to demarcate the borders of the lesion.       Orise gel was injected to raise the lesion. Piecemeal mucosal resection       using a snare was performed. Resection and retrieval were complete. To  prevent bleeding after mucosal resection, four hemostatic clips were       successfully placed (MR conditional). There was no bleeding at the end       of the procedure.      Four sessile polyps were found in the sigmoid colon, descending colon,       transverse colon and ascending colon. The polyps were 3 to 8 mm in size.       These polyps were removed with a cold snare. Resection and retrieval       were complete.      Anal papilla(e) were hypertrophied.      Non-bleeding non-thrombosed external and  internal hemorrhoids were found       during retroflexion, during perianal exam and during digital exam. The       hemorrhoids were Grade III (internal hemorrhoids that prolapse but       require manual reduction). Impression:               - Hemorrhoids found on digital rectal exam.                           - The examined portion of the ileum was normal.                           - One 20 mm polyp at the ileocecal valve, removed                            with mucosal resection. Resected and retrieved.                            Clips (MR conditional) were placed.                           - Four, 3 to 8 mm polyps in the sigmoid colon, in                            the descending colon, in the transverse colon and                            in the ascending colon, removed with a cold snare.                            Resected and retrieved.                           - Anal papilla(e) were hypertrophied.                           - Non-bleeding non-thrombosed external and internal                            hemorrhoids. Moderate Sedation:      Not Applicable - Patient had care per Anesthesia. Recommendation:           - The patient will be observed post-procedure,  until all discharge criteria are met.                           - Discharge patient to home.                           - Patient has a contact number available for                            emergencies. The signs and symptoms of potential                            delayed complications were discussed with the                            patient. Return to normal activities tomorrow.                            Written discharge instructions were provided to the                            patient.                           - Continue present medications.                           - No aspirin, ibuprofen, naproxen, or other                            non-steroidal anti-inflammatory drugs for 2 weeks                             if able.                           - Await pathology results.                           - Repeat colonoscopy within 1 year for surveillance.                           - The findings and recommendations were discussed                            with the patient.                           - The findings and recommendations were discussed                            with the patient's family. Procedure Code(s):        --- Professional ---                           [phone], Colonoscopy, flexible; with endoscopic  mucosal resection                           [id number], 59, Colonoscopy, flexible; with removal of                            tumor(s), polyp(s), or other lesion(s) by snare                            technique Diagnosis Code(s):        --- Professional ---                           K64.2, Third degree hemorrhoids                           K63.5, Polyp of colon                           K62.89, Other specified diseases of anus and rectum CPT copyright 2019 American Medical Association. All rights reserved. The codes documented in this report are preliminary and upon coder review may  be revised to meet current compliance requirements. Justice Britain, MD 06/21/2020 11:26:03 AM Number of Addenda: 0

## 2020-06-21 NOTE — Anesthesia Postprocedure Evaluation (Signed)
Anesthesia Post Note  Patient: Tamara Mendoza  Procedure(s) Performed: COLONOSCOPY WITH PROPOFOL (N/A ) ENDOSCOPIC MUCOSAL RESECTION (N/A ) BIOPSY HEMOSTASIS CLIP PLACEMENT POLYPECTOMY     Patient location during evaluation: PACU Anesthesia Type: MAC Level of consciousness: awake and alert Pain management: pain level controlled Vital Signs Assessment: post-procedure vital signs reviewed and stable Respiratory status: spontaneous breathing, nonlabored ventilation, respiratory function stable and patient connected to nasal cannula oxygen Cardiovascular status: stable and blood pressure returned to baseline Postop Assessment: no apparent nausea or vomiting Anesthetic complications: no   No complications documented.  Last Vitals:  Vitals:   06/21/20 1104 06/21/20 1110  BP: 115/67 116/73  Pulse: 71 65  Resp: 13 14  Temp: 36.6 C   SpO2: 100% 99%    Last Pain:  Vitals:   06/21/20 1110  TempSrc:   PainSc: 0-No pain                 Effie Berkshire

## 2020-06-21 NOTE — Discharge Instructions (Signed)

## 2020-06-21 NOTE — Transfer of Care (Signed)
Immediate Anesthesia Transfer of Care Note  Patient: Tamara Mendoza  Procedure(s) Performed: COLONOSCOPY WITH PROPOFOL (N/A ) ENDOSCOPIC MUCOSAL RESECTION (N/A ) BIOPSY HEMOSTASIS CLIP PLACEMENT POLYPECTOMY  Patient Location: Endoscopy Unit  Anesthesia Type:MAC  Level of Consciousness: awake  Airway & Oxygen Therapy: Patient Spontanous Breathing and Patient connected to face mask oxygen  Post-op Assessment: Report given to RN and Post -op Vital signs reviewed and stable  Post vital signs: Reviewed and stable  Last Vitals:  Vitals Value Taken Time  BP    Temp    Pulse 75 06/21/20 1101  Resp    SpO2 97 % 06/21/20 1101  Vitals shown include unvalidated device data.  Last Pain:  Vitals:   06/21/20 0932  TempSrc: Temporal  PainSc: 0-No pain         Complications: No complications documented.

## 2020-06-21 NOTE — H&P (Signed)
GASTROENTEROLOGY PROCEDURE H&P NOTE   Primary Care Physician: Loman Brooklyn, FNP  HPI: Tamara Mendoza is a 55 y.o. female who presents for Colonoscopy with EMR attempt of ICV SSP.  Past Medical History:  Diagnosis Date  . Anxiety and depression   . Arthritis   . Eczema   . Mixed hyperlipidemia    Past Surgical History:  Procedure Laterality Date  . BIOPSY  04/05/2020   Procedure: BIOPSY;  Surgeon: Eloise Harman, DO;  [facility];  Service: Endoscopy;;  . CESAREAN SECTION    . COLONOSCOPY WITH PROPOFOL N/A 04/05/2020   Procedure: COLONOSCOPY WITH PROPOFOL;  Surgeon: Eloise Harman, DO;  Location: [facility];  Service: Endoscopy;  Laterality: N/A;  11:00 ASA I/II, pt tested + 1/26  . POLYPECTOMY  04/05/2020   Procedure: POLYPECTOMY;  Surgeon: Eloise Harman, DO;  Location: [facility];  Service: Endoscopy;;   No current facility-administered medications for this encounter.   No Known Allergies Family History  Problem Relation Age of Onset  . Hypertension Mother   . Uterine cancer Mother   . Cancer Father   . Diabetes Sister   . Diabetes Brother   . Diabetes Sister    Social History   Socioeconomic History  . Marital status: Single    Spouse name: Not on file  . Number of children: Not on file  . Years of education: Not on file  . Highest education level: Not on file  Occupational History  . Not on file  Tobacco Use  . Smoking status: Current Every Day Smoker    Packs/day: 0.25    Years: 30.00    Pack years: 7.50    Types: Cigarettes  . Smokeless tobacco: Never Used  Vaping Use  . Vaping Use: Never used  Substance and Sexual Activity  . Alcohol use: Yes    Comment: occ  . Drug use: No  . Sexual activity: Not on file  Other Topics Concern  . Not on file  Social History Narrative  . Not on file   Social Determinants of Health   Financial Resource Strain: Not on file  Food Insecurity: Not on file  Transportation Needs: Not  on file  Physical Activity: Not on file  Stress: Not on file  Social Connections: Not on file  Intimate Partner Violence: Not on file    Physical Exam: Vital signs in last 24 hours:     GEN: NAD EYE: Sclerae anicteric ENT: MMM CV: Non-tachycardic GI: Soft, NT/ND NEURO:  Alert & Oriented x 3  Lab Results: No results for input(s): WBC, HGB, HCT, PLT in the last 72 hours. BMET No results for input(s): NA, K, CL, CO2, GLUCOSE, BUN, CREATININE, CALCIUM in the last 72 hours. LFT No results for input(s): PROT, ALBUMIN, AST, ALT, ALKPHOS, BILITOT, BILIDIR, IBILI in the last 72 hours. PT/INR No results for input(s): LABPROT, INR in the last 72 hours.   Impression / Plan: This is a 55 y.o.female who presents for Colonoscopy with EMR attempt of ICV SSP.  Based upon the description and endoscopic pictures I do feel that it is reasonable to pursue an Advanced Polypectomy attempt of the polyp/lesion.  We discussed some of the techniques of advanced polypectomy which include Endoscopic Mucosal Resection, OVESCO Full-Thickness Resection, Endorotor Morcellation, and Tissue Ablation via Fulguration.  We also reviewed images of typical techniques as noted above.  The risks and benefits of endoscopic evaluation were discussed with the patient; these  include but are not limited to the risk of perforation, infection, bleeding, missed lesions, lack of diagnosis, severe illness requiring hospitalization, as well as anesthesia and sedation related illnesses.  During attempts at advanced resection, the risks of bleeding and perforation/leak are increased as opposed to diagnostic and screening procedures, and that was discussed with the patient as well.   In addition, I explained that with the possible need for piecemeal resection, subsequent short-interval endoscopic evaluation for follow up and potential retreatment of the lesion/area may be necessary.  I did offer, a referral to surgery in order for patient  to have opportunity to discuss surgical management/intervention prior to finalizing decision for attempt at endoscopic removal, however, the patient deferred on this.  If, after attempt at removal of the polyp/lesion, it is found that the patient has a complication or that an invasive lesion or malignant lesion is found, or that the polyp/lesion continues to recur, the patient is aware and understands that surgery may still be indicated/required.  All patient questions were answered, to the best of my ability, and the patient agrees to the aforementioned plan of action.  The risks and benefits of endoscopic evaluation were discussed with the patient; these include but are not limited to the risk of perforation, infection, bleeding, missed lesions, lack of diagnosis, severe illness requiring hospitalization, as well as anesthesia and sedation related illnesses.  The patient is agreeable to proceed.    Justice Britain, MD [facility] Advanced Endoscopy Office # [phone]

## 2020-06-22 ENCOUNTER — Encounter (HOSPITAL_COMMUNITY): Payer: Self-pay | Admitting: Gastroenterology

## 2020-06-22 LAB — SURGICAL PATHOLOGY

## 2020-06-23 ENCOUNTER — Encounter: Payer: Self-pay | Admitting: Gastroenterology

## 2020-07-14 ENCOUNTER — Encounter: Payer: Self-pay | Admitting: Nurse Practitioner

## 2020-07-14 ENCOUNTER — Ambulatory Visit (INDEPENDENT_AMBULATORY_CARE_PROVIDER_SITE_OTHER): Payer: BC Managed Care – PPO | Admitting: Nurse Practitioner

## 2020-07-14 DIAGNOSIS — J069 Acute upper respiratory infection, unspecified: Secondary | ICD-10-CM | POA: Diagnosis not present

## 2020-07-14 MED ORDER — DM-GUAIFENESIN ER 30-600 MG PO TB12
1.0000 | ORAL_TABLET | Freq: Two times a day (BID) | ORAL | 0 refills | Status: DC
Start: 2020-07-14 — End: 2020-09-09

## 2020-07-14 MED ORDER — AZITHROMYCIN 250 MG PO TABS: ORAL_TABLET | ORAL | 0 refills | Status: DC

## 2020-07-14 MED ORDER — MOMETASONE FUROATE 50 MCG/ACT NA SUSP
2.0000 | Freq: Every day | NASAL | 12 refills | Status: DC
Start: 2020-07-14 — End: 2021-06-30

## 2020-07-14 NOTE — Assessment & Plan Note (Signed)
Uncontrolled symptoms of abdominal congestion, diarrhea, body aches, hoarse voice and headache. COVID-19 PCR completed results pending. Take medication as prescribed Guaifenesin for cough and congestion Azithromycin 250 mg tablet take 2 tablet day one 1 tablet day 2-5. Nasonex  nasal   Spray Tylenol/ibuprofen for headache/pain/fever.   Rx to pharmacy  Follow-up with worsening or unresolved symptoms

## 2020-07-14 NOTE — Progress Notes (Signed)
   Virtual Visit  Note Due to COVID-19 pandemic this visit was conducted virtually. This visit type was conducted due to national recommendations for restrictions regarding the COVID-19 Pandemic (e.g. social distancing, sheltering in place) in an effort to limit this patient's exposure and mitigate transmission in our community. All issues noted in this document were discussed and addressed.  A physical exam was not performed with this format.  I connected with [name] on 07/14/20 at 1:50 pm By telephone and verified that I am speaking with the correct person using two identifiers. Tamara Mendoza is currently located at home during visit. The provider, Ivy Lynn, NP is located in their office at time of visit.  I discussed the limitations, risks, security and privacy concerns of performing an evaluation and management service by telephone and the availability of in person appointments. I also discussed with the patient that there may be a patient responsible charge related to this service. The patient expressed understanding and agreed to proceed.   History and Present Illness:  URI  This is a new problem. The current episode started yesterday. The problem has been gradually worsening. There has been no fever. Associated symptoms include congestion, coughing, headaches and sinus pain. Pertinent negatives include no diarrhea or rash. She has tried nothing for the symptoms.      Review of Systems  Constitutional: Negative for fever.  HENT: Positive for congestion and sinus pain.   Respiratory: Positive for cough.   Gastrointestinal: Negative for diarrhea.  Skin: Negative for rash.  Neurological: Positive for headaches.     Observations/Objective: Televisit patient not in distress  Assessment and Plan: Upper respiratory infection with cough and congestion Uncontrolled symptoms of abdominal congestion, diarrhea, body aches, hoarse voice and headache. COVID-19 PCR completed  results pending. Take medication as prescribed Guaifenesin for cough and congestion Azithromycin 250 mg tablet take 2 tablet day one 1 tablet day 2-5. Nasonex  nasal   Spray Tylenol/ibuprofen for headache/pain/fever.   Rx to pharmacy  Follow-up with worsening or unresolved symptoms    Follow Up Instructions: Follow-up with worsening unresolved symptoms.    I discussed the assessment and treatment plan with the patient. The patient was provided an opportunity to ask questions and all were answered. The patient agreed with the plan and demonstrated an understanding of the instructions.   The patient was advised to call back or seek an in-person evaluation if the symptoms worsen or if the condition fails to improve as anticipated.  The above assessment and management plan was discussed with the patient. The patient verbalized understanding of and has agreed to the management plan. Patient is aware to call the clinic if symptoms persist or worsen. Patient is aware when to return to the clinic for a follow-up visit. Patient educated on when it is appropriate to go to the emergency department.   Time call ended: 2 PM  I provided 10 minutes of  non face-to-face time during this encounter.    Ivy Lynn, NP

## 2020-07-14 NOTE — Patient Instructions (Signed)

## 2020-07-15 ENCOUNTER — Ambulatory Visit: Payer: BC Managed Care – PPO | Admitting: Orthopaedic Surgery

## 2020-07-15 ENCOUNTER — Telehealth: Payer: Self-pay | Admitting: Family Medicine

## 2020-07-15 LAB — SARS-COV-2, NAA 2 DAY TAT

## 2020-07-15 LAB — NOVEL CORONAVIRUS, NAA: SARS-CoV-2, NAA: DETECTED — AB

## 2020-07-15 NOTE — Telephone Encounter (Signed)
Note for work written for pt

## 2020-07-16 ENCOUNTER — Other Ambulatory Visit: Payer: Self-pay | Admitting: Physician Assistant

## 2020-07-16 MED ORDER — NIRMATRELVIR/RITONAVIR (PAXLOVID)TABLET: 3.0000 | ORAL_TABLET | Freq: Two times a day (BID) | ORAL | 0 refills | Status: DC

## 2020-07-16 NOTE — Progress Notes (Signed)
Outpatient Oral COVID Treatment Note  I connected with Tamara Mendoza on 07/16/2020/8:56 AM by telephone and verified that I am speaking with the correct person using two identifiers.  I discussed the limitations, risks, security, and privacy concerns of performing an evaluation and management service by telephone and the availability of in person appointments. I also discussed with the patient that there may be a patient responsible charge related to this service. The patient expressed understanding and agreed to proceed.  Patient location: home  Provider location: office  Diagnosis: COVID-19 infection  Purpose of visit: Discussion of potential use of Molnupiravir or Paxlovid, a new treatment for mild to moderate COVID-19 viral infection in non-hospitalized patients.   Subjective: Patient is a 55 y.o. female who has been diagnosed with COVID 19 viral infection.  Their symptoms began on 5/30 with cough or cold.    Past Medical History:  Diagnosis Date  . Anxiety and depression   . Arthritis   . Eczema   . Mixed hyperlipidemia     No Known Allergies   Current Outpatient Medications:  .  azithromycin (ZITHROMAX) 250 MG tablet, 6 tablets day 1, 5 tablet day 2, 4 tablet day 3, 3 tablet day 4, 2 tablet day 5, 1 tablet day 6, Disp: 1 each, Rfl: 0 .  Calcium Carb-Cholecalciferol (CALCIUM + D3 PO), Take 1 tablet by mouth in the morning., Disp: , Rfl:  .  Cyanocobalamin (VITAMIN B12 PO), Take 2 tablets by mouth in the morning. Gummies, Disp: , Rfl:  .  dextromethorphan-guaiFENesin (MUCINEX DM) 30-600 MG 12hr tablet, Take 1 tablet by mouth 2 (two) times daily., Disp: 30 tablet, Rfl: 0 .  escitalopram (LEXAPRO) 10 MG tablet, Take 1 tablet (10 mg total) by mouth daily. (Patient taking differently: Take 10 mg by mouth in the morning.), Disp: 90 tablet, Rfl: 1 .  ibuprofen (ADVIL) 600 MG tablet, Take 600 mg by mouth daily as needed (pain.)., Disp: , Rfl:  .  meloxicam (MOBIC) 15 MG tablet, Take 1 tablet  (15 mg total) by mouth daily. (Patient taking differently: Take 15 mg by mouth in the morning.), Disp: 30 tablet, Rfl: 2 .  mometasone (NASONEX) 50 MCG/ACT nasal spray, Place 2 sprays into the nose daily., Disp: 1 each, Rfl: 12 .  triamcinolone cream (KENALOG) 0.1 %, Apply 1 application topically 2 (two) times daily. (Patient taking differently: Apply 1 application topically daily.), Disp: 80 g, Rfl: 2  Objective: Patient sounds congested.  They are in no apparent distress.  Breathing is non labored.  Mood and behavior are normal.  Laboratory Data:  Recent Results (from the past 2160 hour(s))  SARS CORONAVIRUS 2 (TAT 6-24 HRS) Nasopharyngeal Nasopharyngeal Swab     Status: None   Collection Time: 06/17/20 10:12 AM   Specimen: Nasopharyngeal Swab  Result Value Ref Range   SARS Coronavirus 2 NEGATIVE NEGATIVE    Comment: (NOTE) SARS-CoV-2 target nucleic acids are NOT DETECTED.  The SARS-CoV-2 RNA is generally detectable in upper and lower respiratory specimens during the acute phase of infection. Negative results do not preclude SARS-CoV-2 infection, do not rule out co-infections with other pathogens, and should not be used as the sole basis for treatment or other patient management decisions. Negative results must be combined with clinical observations, patient history, and epidemiological information. The expected result is Negative.  Fact Sheet for Patients: SugarRoll.be  Fact Sheet for Healthcare Providers: https://www.woods-mathews.com/  This test is not yet approved or cleared by the [place] FDA and  has been authorized for detection and/or diagnosis of SARS-CoV-2 by FDA under an Emergency Use Authorization (EUA). This EUA will remain  in effect (meaning this test can be used) for the duration of the COVID-19 declaration under Se ction 564(b)(1) of the Act, 21 U.S.C. section 360bbb-3(b)(1), unless the authorization is terminated  or revoked sooner.  Performed at [facility], [place] [address], [place], [place] [address]   Surgical pathology     Status: None   Collection Time: 06/21/20 10:25 AM  Result Value Ref Range   SURGICAL PATHOLOGY      SURGICAL PATHOLOGY CASE: [id number] PATIENT: Tamara Mendoza Surgical Pathology Report     Clinical History: Colon polyp; hemorrhoids (crm)     FINAL MICROSCOPIC DIAGNOSIS:  A. ILEOCECAL VALVE, EMR, BIOPSY: - Sessile serrated polyp. - No dysplasia or malignancy.  B. COLON, ASCENDING, TRANSVERSE, DESCENDING, SIGMOID, POLYPECTOMY: - Multiple fragments of sessile serrated polyp and hyperplastic polyp. - No dysplasia or malignancy.   GROSS DESCRIPTION:  Specimen A: Received in formalin are multiple irregular and polypoid tan-pink soft tissues, 1.8 x 1.6 x 0.2 cm.  In toto 1 block.  Specimen B: Received in formalin are multiple irregular and polypoid tan-pink to dark red soft tissues, 2 x 1.8 x 0.3 cm in aggregate. Entirely submitted 1 block.  SW 06/21/2020   Final Diagnosis performed by Vicente Males, MD.   Electronically signed 06/22/2020 Technical and / or Professional components performed at [facility], [place] [address], [place], [place] [address].  Immunohistochemistry Technical component (if applicable) was performed at [facility]. [address], [place], [place], [place] [address].   IMMUNOHISTOCHEMISTRY DISCLAIMER (if applicable): Some of these immunohistochemical stains may have been developed and the performance characteristics determine by [facility]. Some may not have been cleared or approved by the U.S. Food and Drug Administration. The FDA has determined that such clearance or approval is not necessary. This test is used for clinical purposes. It should not be regarded as investigational or for research. This laboratory is certified under the [place] (CLIA-88) as qualified to perform high complexity clinical laboratory testing.  The controls stained appropriately.   Novel Coronavirus, NAA ([facility])     Status: Abnormal   Collection Time: 07/14/20  2:46 PM   Specimen: Nasopharyngeal(NP) swabs in vial transport medium  Result Value Ref Range   SARS-CoV-2, NAA Detected (A) Not Detected    Comment: Patients who have a positive COVID-19 test result may now have treatment options. Treatment options are available for patients with mild to moderate symptoms and for hospitalized patients. Visit our website at [email or web address] for resources and information. This nucleic acid amplification test was developed and its performance characteristics determined by [employer]. Nucleic acid amplification tests include RT-PCR and TMA. This test has not been FDA cleared or approved. This test has been authorized by FDA under an Emergency Use Authorization (EUA). This test is only authorized for the duration of time the declaration that circumstances exist justifying the authorization of the emergency use of in vitro diagnostic tests for detection of SARS-CoV-2 virus and/or diagnosis of COVID-19 infection under section 564(b)(1) of the Act, 21 U.S.C. 527POE-4(M) (1), unless the authorization is terminated or revoked sooner. When diagnostic testing is negativ e, the possibility of a false negative result should be considered in the context of a patient's recent exposures and the presence of clinical signs and symptoms consistent with COVID-19. An individual without  symptoms of COVID-19 and who is not shedding SARS-CoV-2 virus would expect to have a negative (not detected) result in this assay.   SARS-COV-2, NAA 2 DAY TAT     Status: None   Collection Time: 07/14/20  2:46 PM  Result Value Ref Range   SARS-CoV-2, NAA 2 DAY TAT Performed      Assessment: 54 y.o. female with mild/moderate COVID 19 viral infection  diagnosed on 6/2 at high risk for progression to severe COVID 19.  Plan:  This patient is a 55 y.o. female that meets the following criteria for Emergency Use Authorization of: Paxlovid 1. Age >12 yr AND > 40 kg 2. SARS-COV-2 positive test 3. Symptom onset < 5 days 4. Mild-to-moderate COVID disease with high risk for severe progression to hospitalization or death  I have spoken and communicated the following to the patient or parent/caregiver regarding: 1. Paxlovid is an unapproved drug that is authorized for use under an Emergency Use Authorization.  2. There are no adequate, approved, available products for the treatment of COVID-19 in adults who have mild-to-moderate COVID-19 and are at high risk for progressing to severe COVID-19, including hospitalization or death. 3. Other therapeutics are currently authorized. For additional information on all products authorized for treatment or prevention of COVID-19, please see TanEmporium.pl.  4. There are benefits and risks of taking this treatment as outlined in the "Fact Sheet for Patients and Caregivers."  5. "Fact Sheet for Patients and Caregivers" was reviewed with patient. A hard copy will be provided to patient from pharmacy prior to the patient receiving treatment. 6. Patients should continue to self-isolate and use infection control measures (e.g., wear mask, isolate, social distance, avoid sharing personal items, clean and disinfect "high touch" surfaces, and frequent handwashing) according to CDC guidelines.  7. The patient or parent/caregiver has the option to accept or refuse treatment. 8. Patient medication history was reviewed for potential drug interactions:Interaction with home meds: flonase and triamcinalone cream 9. Patient's GFR was calculated to be >60, and they were therefore prescribed Normal dose (GFR>60) - nirmatrelvir  150mg  tab (2 tablet) by mouth twice daily AND ritonavir 100mg  tab (1 tablet) by mouth twice daily   After reviewing above information with the patient, the patient agrees to receive Paxlovid.  Follow up instructions:    . Take prescription BID x 5 days as directed . Reach out to pharmacist for counseling on medication if desired . For concerns regarding further COVID symptoms please follow up with your PCP or urgent care . For urgent or life-threatening issues, seek care at your local emergency department  The patient was provided an opportunity to ask questions, and all were answered. The patient agreed with the plan and demonstrated an understanding of the instructions.   Script sent to [facility] and opted to pick up RX.  The patient was advised to call their PCP or seek an in-person evaluation if the symptoms worsen or if the condition fails to improve as anticipated.   I provided 10 minutes of non face-to-face telephone visit time during this encounter, and > 50% was spent counseling as documented under my assessment & plan.  Angelena Form, PA-C 07/16/2020 /8:56 AM

## 2020-07-29 ENCOUNTER — Encounter: Payer: Self-pay | Admitting: Orthopaedic Surgery

## 2020-07-29 ENCOUNTER — Ambulatory Visit (INDEPENDENT_AMBULATORY_CARE_PROVIDER_SITE_OTHER): Payer: BC Managed Care – PPO | Admitting: Orthopaedic Surgery

## 2020-07-29 ENCOUNTER — Other Ambulatory Visit: Payer: Self-pay

## 2020-07-29 VITALS — Ht 62.0 in | Wt 189.0 lb

## 2020-07-29 DIAGNOSIS — M1711 Unilateral primary osteoarthritis, right knee: Secondary | ICD-10-CM | POA: Diagnosis not present

## 2020-07-29 NOTE — Progress Notes (Signed)
Office Visit Note   Patient: Tamara Mendoza           Date of Birth: [birth year]           MRN: [id number] Visit Date: 07/29/2020              Requested by: Loman Brooklyn, [place],  [place] [id number] PCP: Loman Brooklyn, FNP   Assessment & Plan: Visit Diagnoses:  1. Unilateral primary osteoarthritis, right knee     Plan: More symptomatic right knee osteoarthritis and left.  Continue anti-inflammatories.  We discussed trying to get access to pool where she could swim or ride an exercise bike for weight loss.  She understands eventually she required total knee arthroplasty.  Recheck 6 months.  If she gets significant increase in symptoms she can return earlier.  Follow-Up Instructions: Return in about 6 months (around 01/28/2021).   Orders:  No orders of the defined types were placed in this encounter.  No orders of the defined types were placed in this encounter.     Procedures: No procedures performed   Clinical Data: No additional findings.   Subjective: Chief Complaint  Patient presents with   Right Knee - Follow-up    HPI 55 year old female returns with right greater than left knee osteoarthritis symptoms.  She has had previous injection which gave her some relief.  She has meloxicam.  In the past she is also used ibuprofen.  She currently takes Tylenol but states she tries to only use the meloxicam occasionally and is not using ibuprofen currently. Mild swelling of her knee after work.  She works at [employer] up on her feet a lot and is back up to 6 days/week. Review of Systems All the systems updated unchanged.  Objective: Vital Signs: Ht 5\' 2"  (1.575 m)   Wt 189 lb (85.7 kg)   BMI 34.57 kg/m   Physical Exam Constitutional:      Appearance: She is well-developed.  HENT:     Head: Normocephalic.     Right Ear: External ear normal.     Left Ear: External ear normal. There is no impacted cerumen.  Eyes:     Pupils: Pupils are equal,  round, and reactive to light.  Neck:     Thyroid: No thyromegaly.     Trachea: No tracheal deviation.  Cardiovascular:     Rate and Rhythm: Normal rate.  Pulmonary:     Effort: Pulmonary effort is normal.  Abdominal:     Palpations: Abdomen is soft.  Musculoskeletal:     Cervical back: No rigidity.  Skin:    General: Skin is warm and dry.  Neurological:     Mental Status: She is alert and oriented to person, place, and time.  Psychiatric:        Behavior: Behavior normal.    Ortho Exam patient has medial lateral joint line tenderness crepitus range of motion.  Slightly short stride gait.  She goes up on a step with left foot first and then repeats.  Limitation right hip range of motion only 5 degrees internal rotation nonpainful.  Sensation the foot is intact.  Specialty Comments:  No specialty comments available.  Imaging: No results found.   PMFS History: Patient Active Problem List   Diagnosis Date Noted   Upper respiratory infection with cough and congestion 07/14/2020   Unilateral primary osteoarthritis, right knee 06/10/2020   Arthritis 04/01/2020   Mixed hyperlipidemia    Difficulty sleeping 11/16/2019  Hot flashes 11/16/2019   Eczema of both hands 11/16/2019   Tobacco use 11/13/2019   Anxiety and depression 10/02/2019   Past Medical History:  Diagnosis Date   Anxiety and depression    Arthritis    Eczema    Mixed hyperlipidemia     Family History  Problem Relation Age of Onset   Hypertension Mother    Uterine cancer Mother    Cancer Father    Diabetes Sister    Diabetes Brother    Diabetes Sister     Past Surgical History:  Procedure Laterality Date   BIOPSY  04/05/2020   Procedure: BIOPSY;  Surgeon: Eloise Harman, DO;  [facility];  Service: Endoscopy;;   BIOPSY  06/21/2020   Procedure: BIOPSY;  Surgeon: Irving Copas., MD;  Location: [facility];  Service: Gastroenterology;;   CESAREAN SECTION     COLONOSCOPY WITH  PROPOFOL N/A 04/05/2020   Procedure: COLONOSCOPY WITH PROPOFOL;  Surgeon: Eloise Harman, DO;  [facility];  Service: Endoscopy;  Laterality: N/A;  11:00 ASA I/II, pt tested + 1/26   COLONOSCOPY WITH PROPOFOL N/A 06/21/2020   Procedure: COLONOSCOPY WITH PROPOFOL;  Surgeon: Rush Landmark Telford Nab., MD;  [facility];  Service: Gastroenterology;  Laterality: N/A;   ENDOSCOPIC MUCOSAL RESECTION N/A 06/21/2020   Procedure: ENDOSCOPIC MUCOSAL RESECTION;  Surgeon: Rush Landmark Telford Nab., MD;  [facility];  Service: Gastroenterology;  Laterality: N/A;   HEMOSTASIS CLIP PLACEMENT  06/21/2020   Procedure: HEMOSTASIS CLIP PLACEMENT;  Surgeon: Irving Copas., MD;  Location: [facility];  Service: Gastroenterology;;   POLYPECTOMY  04/05/2020   Procedure: POLYPECTOMY;  Surgeon: Eloise Harman, DO;  [facility];  Service: Endoscopy;;   POLYPECTOMY  06/21/2020   Procedure: POLYPECTOMY;  Surgeon: Irving Copas., MD;  Location: [facility];  Service: Gastroenterology;;   Social History   Occupational History   Not on file  Tobacco Use   Smoking status: Every Day    Packs/day: 0.25    Years: 30.00    Pack years: 7.50    Types: Cigarettes   Smokeless tobacco: Never  Vaping Use   Vaping Use: Never used  Substance and Sexual Activity   Alcohol use: Yes    Comment: occ   Drug use: No   Sexual activity: Not on file

## 2020-09-02 ENCOUNTER — Other Ambulatory Visit: Payer: Self-pay | Admitting: Family Medicine

## 2020-09-02 DIAGNOSIS — M1990 Unspecified osteoarthritis, unspecified site: Secondary | ICD-10-CM

## 2020-09-06 ENCOUNTER — Ambulatory Visit: Payer: BC Managed Care – PPO | Admitting: Nurse Practitioner

## 2020-09-09 ENCOUNTER — Ambulatory Visit: Payer: BC Managed Care – PPO | Admitting: Family Medicine

## 2020-09-09 ENCOUNTER — Other Ambulatory Visit: Payer: Self-pay

## 2020-09-09 ENCOUNTER — Encounter: Payer: Self-pay | Admitting: Family Medicine

## 2020-09-09 VITALS — BP 123/85 | HR 75 | Temp 97.4°F | Ht 62.0 in | Wt 191.2 lb

## 2020-09-09 DIAGNOSIS — G479 Sleep disorder, unspecified: Secondary | ICD-10-CM | POA: Diagnosis not present

## 2020-09-09 DIAGNOSIS — E66811 Obesity, class 1: Secondary | ICD-10-CM | POA: Insufficient documentation

## 2020-09-09 DIAGNOSIS — G5762 Lesion of plantar nerve, left lower limb: Secondary | ICD-10-CM

## 2020-09-09 DIAGNOSIS — E669 Obesity, unspecified: Secondary | ICD-10-CM | POA: Diagnosis not present

## 2020-09-09 NOTE — Patient Instructions (Signed)
Zzzquil for sleep.

## 2020-09-09 NOTE — Progress Notes (Signed)
Assessment & Plan:  1. Morton's neuroma of left foot Education provided on Morton's neuralgia.  Encourage patient to purchase a pad to put under her toe.  Continue ibuprofen or meloxicam for pain relief.  2. Difficulty sleeping Encouraged to try ZzzQuil for sleep.  3. Obesity (BMI 30.0-34.9) Continue healthy eating.  Encouraged to start exercising outside of work.  Patient to schedule an appointment with our clinical pharmacist to aid in getting a medication for weight loss.  Education provided on obesity.   Return for with Almyra Free for weight loss medication.  Hendricks Limes, MSN, APRN, FNP-C [facility]  Subjective:    Patient ID: Tamara Mendoza, female    DOB: 1965-12-23, 55 y.o.   MRN: [id number]  Patient Care Team: Loman Brooklyn, FNP as PCP - General (Family Medicine) Eloise Harman, DO as Consulting Physician (Internal Medicine)   Chief Complaint:  Chief Complaint  Patient presents with   Leg Pain    HPI: Tamara Mendoza is a 55 y.o. female presenting on 09/09/2020 for Leg Pain  Patient reports pain in the ball of her foot under her second toe of the left foot.  It is worse when she is up walking all day at work.  When the pain is at its worst it radiates across the ball of her foot to her other toes.  If she takes meloxicam or ibuprofen it does relieve the pain, but it returns.  She describes the pain as sharp, shooting, and sometimes throbbing.  New complaints: Patient reports she has difficulty sleeping.  She has not tried any medications as she did not know what to try.  Patient is concerned about her weight.  She has been trying to eat healthier.  She has cut out sodas.  She is on her feet at work walking all day and therefore does not do any structured exercise outside of work.  She was encouraged by her orthopedic to try to lose some weight due to her knees.   Social history:  Relevant past medical, surgical, family and social history  reviewed and updated as indicated. Interim medical history since our last visit reviewed.  Allergies and medications reviewed and updated.  DATA REVIEWED: CHART IN EPIC  ROS: Negative unless specifically indicated above in HPI.    Current Outpatient Medications:    Calcium Carb-Cholecalciferol (CALCIUM + D3 PO), Take 1 tablet by mouth in the morning., Disp: , Rfl:    Cyanocobalamin (VITAMIN B12 PO), Take 2 tablets by mouth in the morning. Gummies, Disp: , Rfl:    escitalopram (LEXAPRO) 10 MG tablet, Take 1 tablet (10 mg total) by mouth daily. (Patient taking differently: Take 10 mg by mouth in the morning.), Disp: 90 tablet, Rfl: 1   ibuprofen (ADVIL) 600 MG tablet, Take 600 mg by mouth daily as needed (pain.)., Disp: , Rfl:    meloxicam (MOBIC) 15 MG tablet, Take 1 tablet by mouth once daily, Disp: 30 tablet, Rfl: 0   mometasone (NASONEX) 50 MCG/ACT nasal spray, Place 2 sprays into the nose daily., Disp: 1 each, Rfl: 12   triamcinolone cream (KENALOG) 0.1 %, Apply 1 application topically 2 (two) times daily. (Patient taking differently: Apply 1 application topically daily.), Disp: 80 g, Rfl: 2   No Known Allergies Past Medical History:  Diagnosis Date   Anxiety and depression    Arthritis    Eczema    Mixed hyperlipidemia     Past Surgical History:  Procedure Laterality Date  BIOPSY  04/05/2020   Procedure: BIOPSY;  Surgeon: Eloise Harman, DO;  [facility];  Service: Endoscopy;;   BIOPSY  06/21/2020   Procedure: BIOPSY;  Surgeon: Irving Copas., MD;  Location: [facility];  Service: Gastroenterology;;   CESAREAN SECTION     COLONOSCOPY WITH PROPOFOL N/A 04/05/2020   Procedure: COLONOSCOPY WITH PROPOFOL;  Surgeon: Eloise Harman, DO;  [facility];  Service: Endoscopy;  Laterality: N/A;  11:00 ASA I/II, pt tested + 1/26   COLONOSCOPY WITH PROPOFOL N/A 06/21/2020   Procedure: COLONOSCOPY WITH PROPOFOL;  Surgeon: Rush Landmark Telford Nab., MD;   [facility];  Service: Gastroenterology;  Laterality: N/A;   ENDOSCOPIC MUCOSAL RESECTION N/A 06/21/2020   Procedure: ENDOSCOPIC MUCOSAL RESECTION;  Surgeon: Rush Landmark Telford Nab., MD;  [facility];  Service: Gastroenterology;  Laterality: N/A;   HEMOSTASIS CLIP PLACEMENT  06/21/2020   Procedure: HEMOSTASIS CLIP PLACEMENT;  Surgeon: Irving Copas., MD;  Location: [facility];  Service: Gastroenterology;;   POLYPECTOMY  04/05/2020   Procedure: POLYPECTOMY;  Surgeon: Eloise Harman, DO;  [facility];  Service: Endoscopy;;   POLYPECTOMY  06/21/2020   Procedure: POLYPECTOMY;  Surgeon: Irving Copas., MD;  Location: [facility];  Service: Gastroenterology;;    Social History   Socioeconomic History   Marital status: Single    Spouse name: Not on file   Number of children: Not on file   Years of education: Not on file   Highest education level: Not on file  Occupational History   Not on file  Tobacco Use   Smoking status: Every Day    Packs/day: 0.25    Years: 30.00    Pack years: 7.50    Types: Cigarettes   Smokeless tobacco: Never  Vaping Use   Vaping Use: Never used  Substance and Sexual Activity   Alcohol use: Yes    Comment: occ   Drug use: No   Sexual activity: Not on file  Other Topics Concern   Not on file  Social History Narrative   Not on file   Social Determinants of Health   Financial Resource Strain: Not on file  Food Insecurity: Not on file  Transportation Needs: Not on file  Physical Activity: Not on file  Stress: Not on file  Social Connections: Not on file  Intimate Partner Violence: Not on file        Objective:    BP 123/85   Pulse 75   Temp (!) 97.4 F (36.3 C)   Ht '5\' 2"'$  (1.575 m)   Wt 191 lb 3.2 oz (86.7 kg)   SpO2 98%   BMI 34.97 kg/m   Wt Readings from Last 3 Encounters:  09/09/20 191 lb 3.2 oz (86.7 kg)  07/29/20 189 lb (85.7 kg)  06/21/20 189 lb (85.7 kg)    Physical  Exam Vitals reviewed.  Constitutional:      General: She is not in acute distress.    Appearance: Normal appearance. She is obese. She is not ill-appearing, toxic-appearing or diaphoretic.  HENT:     Head: Normocephalic and atraumatic.  Eyes:     General: No scleral icterus.       Right eye: No discharge.        Left eye: No discharge.     Conjunctiva/sclera: Conjunctivae normal.  Cardiovascular:     Rate and Rhythm: Normal rate.  Pulmonary:     Effort: Pulmonary effort is normal. No respiratory distress.  Musculoskeletal:        General: Normal range of motion.     Cervical back: Normal range of motion.       Feet:  Feet:     Comments: Area of pain. Skin:    General: Skin is warm and dry.     Capillary Refill: Capillary refill takes less than 2 seconds.  Neurological:     General: No focal deficit present.     Mental Status: She is alert and oriented to person, place, and time. Mental status is at baseline.  Psychiatric:        Mood and Affect: Mood normal.        Behavior: Behavior normal.        Thought Content: Thought content normal.        Judgment: Judgment normal.    Lab Results  Component Value Date   TSH 1.580 11/13/2019   Lab Results  Component Value Date   WBC 6.4 11/13/2019   HGB 14.2 11/13/2019   HCT 43.2 11/13/2019   MCV 86 11/13/2019   PLT 268 11/13/2019   Lab Results  Component Value Date   NA 139 11/13/2019   K 4.2 11/13/2019   CO2 24 11/13/2019   GLUCOSE 88 11/13/2019   BUN 11 11/13/2019   CREATININE 0.61 11/13/2019   BILITOT 0.4 11/13/2019   ALKPHOS 92 11/13/2019   AST 17 11/13/2019   ALT 17 11/13/2019   PROT 6.5 11/13/2019   ALBUMIN 4.3 11/13/2019   CALCIUM 9.1 11/13/2019   Lab Results  Component Value Date   CHOL 223 (H) 11/13/2019   Lab Results  Component Value Date   HDL 38 (L) 11/13/2019   Lab Results  Component Value Date   LDLCALC 155 (H) 11/13/2019   Lab Results  Component Value Date   TRIG 163 (H) 11/13/2019    Lab Results  Component Value Date   CHOLHDL 5.9 (H) 11/13/2019   No results found for: HGBA1C

## 2020-09-23 ENCOUNTER — Other Ambulatory Visit: Payer: Self-pay

## 2020-09-23 ENCOUNTER — Ambulatory Visit: Payer: BC Managed Care – PPO | Admitting: Family Medicine

## 2020-09-23 ENCOUNTER — Encounter: Payer: Self-pay | Admitting: Family Medicine

## 2020-09-23 VITALS — BP 112/78 | HR 72 | Temp 97.8°F | Ht 62.0 in | Wt 189.2 lb

## 2020-09-23 DIAGNOSIS — F32A Depression, unspecified: Secondary | ICD-10-CM

## 2020-09-23 DIAGNOSIS — F419 Anxiety disorder, unspecified: Secondary | ICD-10-CM | POA: Diagnosis not present

## 2020-09-23 DIAGNOSIS — G479 Sleep disorder, unspecified: Secondary | ICD-10-CM

## 2020-09-23 DIAGNOSIS — M1990 Unspecified osteoarthritis, unspecified site: Secondary | ICD-10-CM

## 2020-09-23 MED ORDER — ESCITALOPRAM OXALATE 10 MG PO TABS: 10.0000 mg | ORAL_TABLET | Freq: Every day | ORAL | 1 refills | Status: DC

## 2020-09-23 MED ORDER — MELOXICAM 15 MG PO TABS: 15.0000 mg | ORAL_TABLET | Freq: Every day | ORAL | 1 refills | Status: DC

## 2020-09-23 NOTE — Progress Notes (Signed)
Assessment & Plan:  1. Anxiety and depression Well controlled on current regimen.  - escitalopram (LEXAPRO) 10 MG tablet; Take 1 tablet (10 mg total) by mouth daily.  Dispense: 90 tablet; Refill: 1  2. Arthritis Well controlled on current regimen.  - meloxicam (MOBIC) 15 MG tablet; Take 1 tablet (15 mg total) by mouth daily.  Dispense: 90 tablet; Refill: 1  3. Difficulty sleeping Encouraged to try Melatonin, Unisom, or Zzzquil.    Return in about 6 months (around 03/26/2021) for annual physical.  Hendricks Limes, MSN, APRN, FNP-C [facility]  Subjective:    Patient ID: Tamara Mendoza, female    DOB: 1965/04/14, 55 y.o.   MRN: [id number]  Patient Care Team: Loman Brooklyn, FNP as PCP - General (Family Medicine) Eloise Harman, DO as Consulting Physician (Internal Medicine)   Chief Complaint:  Chief Complaint  Patient presents with   Hyperlipidemia   Anxiety   Depression    6 month follow up on chronic medical conditions     HPI: Tamara Mendoza is a 55 y.o. female presenting on 09/23/2020 for Hyperlipidemia, Anxiety, and Depression (6 month follow up on chronic medical conditions )  Difficulty sleeping: has not yet tried OTC sleep aids.  Arthritis: using meloxicam and arthritis cream.  Anxiety/Depression: feels she is doing okay on current dosage of Lexapro. Does not desire a dose change.   Depression screen [facility] 2/9 09/23/2020 09/09/2020 04/01/2020  Decreased Interest '3 2 2  '$ Down, Depressed, Hopeless '2 2 1  '$ PHQ - 2 Score '5 4 3  '$ Altered sleeping '3 3 3  '$ Tired, decreased energy '3 3 2  '$ Change in appetite '3 2 2  '$ Feeling bad or failure about yourself  1 2 0  Trouble concentrating 1 0 1  Moving slowly or fidgety/restless 0 0 1  Suicidal thoughts 0 0 0  PHQ-9 Score '16 14 12  '$ Difficult doing work/chores Not difficult at all - -   GAD 7 : Generalized Anxiety Score 09/23/2020 09/09/2020 04/01/2020 12/25/2019  Nervous, Anxious, on Edge '1 2 1 1   '$ Control/stop worrying '2 2 2 3  '$ Worry too much - different things '2 2 3 3  '$ Trouble relaxing '2 2 1 2  '$ Restless '1 1 1 1  '$ Easily annoyed or irritable '1 2 2 2  '$ Afraid - awful might happen 0 '1 2 1  '$ Total GAD 7 Score '9 12 12 13  '$ Anxiety Difficulty Not difficult at all - Not difficult at all Somewhat difficult   New complaints: None  Social history:  Relevant past medical, surgical, family and social history reviewed and updated as indicated. Interim medical history since our last visit reviewed.  Allergies and medications reviewed and updated.  DATA REVIEWED: CHART IN EPIC  ROS: Negative unless specifically indicated above in HPI.    Current Outpatient Medications:    Calcium Carb-Cholecalciferol (CALCIUM + D3 PO), Take 1 tablet by mouth in the morning., Disp: , Rfl:    Cyanocobalamin (VITAMIN B12 PO), Take 2 tablets by mouth in the morning. Gummies, Disp: , Rfl:    escitalopram (LEXAPRO) 10 MG tablet, Take 1 tablet (10 mg total) by mouth daily. (Patient taking differently: Take 10 mg by mouth in the morning.), Disp: 90 tablet, Rfl: 1   ibuprofen (ADVIL) 600 MG tablet, Take 600 mg by mouth daily as needed (pain.)., Disp: , Rfl:    meloxicam (MOBIC) 15 MG tablet, Take 1 tablet by mouth once daily, Disp: 30 tablet,  Rfl: 0   mometasone (NASONEX) 50 MCG/ACT nasal spray, Place 2 sprays into the nose daily., Disp: 1 each, Rfl: 12   triamcinolone cream (KENALOG) 0.1 %, Apply 1 application topically 2 (two) times daily. (Patient taking differently: Apply 1 application topically daily.), Disp: 80 g, Rfl: 2   No Known Allergies Past Medical History:  Diagnosis Date   Anxiety and depression    Arthritis    Eczema    Mixed hyperlipidemia     Past Surgical History:  Procedure Laterality Date   BIOPSY  04/05/2020   Procedure: BIOPSY;  Surgeon: Eloise Harman, DO;  [facility];  Service: Endoscopy;;   BIOPSY  06/21/2020   Procedure: BIOPSY;  Surgeon: Irving Copas., MD;   Location: [facility];  Service: Gastroenterology;;   CESAREAN SECTION     COLONOSCOPY WITH PROPOFOL N/A 04/05/2020   Procedure: COLONOSCOPY WITH PROPOFOL;  Surgeon: Eloise Harman, DO;  [facility];  Service: Endoscopy;  Laterality: N/A;  11:00 ASA I/II, pt tested + 1/26   COLONOSCOPY WITH PROPOFOL N/A 06/21/2020   Procedure: COLONOSCOPY WITH PROPOFOL;  Surgeon: Rush Landmark Telford Nab., MD;  [facility];  Service: Gastroenterology;  Laterality: N/A;   ENDOSCOPIC MUCOSAL RESECTION N/A 06/21/2020   Procedure: ENDOSCOPIC MUCOSAL RESECTION;  Surgeon: Rush Landmark Telford Nab., MD;  [facility];  Service: Gastroenterology;  Laterality: N/A;   HEMOSTASIS CLIP PLACEMENT  06/21/2020   Procedure: HEMOSTASIS CLIP PLACEMENT;  Surgeon: Irving Copas., MD;  Location: [facility];  Service: Gastroenterology;;   POLYPECTOMY  04/05/2020   Procedure: POLYPECTOMY;  Surgeon: Eloise Harman, DO;  [facility];  Service: Endoscopy;;   POLYPECTOMY  06/21/2020   Procedure: POLYPECTOMY;  Surgeon: Irving Copas., MD;  Location: [facility];  Service: Gastroenterology;;    Social History   Socioeconomic History   Marital status: Single    Spouse name: Not on file   Number of children: Not on file   Years of education: Not on file   Highest education level: Not on file  Occupational History   Not on file  Tobacco Use   Smoking status: Every Day    Packs/day: 0.25    Years: 30.00    Pack years: 7.50    Types: Cigarettes   Smokeless tobacco: Never  Vaping Use   Vaping Use: Never used  Substance and Sexual Activity   Alcohol use: Yes    Comment: occ   Drug use: No   Sexual activity: Not on file  Other Topics Concern   Not on file  Social History Narrative   Not on file   Social Determinants of Health   Financial Resource Strain: Not on file  Food Insecurity: Not on file  Transportation Needs: Not on file  Physical Activity: Not on file   Stress: Not on file  Social Connections: Not on file  Intimate Partner Violence: Not on file        Objective:    BP 112/78   Pulse 72   Temp 97.8 F (36.6 C) (Temporal)   Ht '5\' 2"'$  (1.575 m)   Wt 189 lb 3.2 oz (85.8 kg)   SpO2 95%   BMI 34.61 kg/m   Wt Readings from Last 3 Encounters:  09/23/20 189 lb 3.2 oz (85.8 kg)  09/09/20 191 lb 3.2 oz (86.7 kg)  07/29/20 189 lb (85.7 kg)    Physical Exam Vitals reviewed.  Constitutional:      General: She is  not in acute distress.    Appearance: Normal appearance. She is obese. She is not ill-appearing, toxic-appearing or diaphoretic.  HENT:     Head: Normocephalic and atraumatic.  Eyes:     General: No scleral icterus.       Right eye: No discharge.        Left eye: No discharge.     Conjunctiva/sclera: Conjunctivae normal.  Cardiovascular:     Rate and Rhythm: Normal rate and regular rhythm.     Heart sounds: Normal heart sounds. No murmur heard.   No friction rub. No gallop.  Pulmonary:     Effort: Pulmonary effort is normal. No respiratory distress.     Breath sounds: Normal breath sounds. No stridor. No wheezing, rhonchi or rales.  Musculoskeletal:        General: Normal range of motion.     Cervical back: Normal range of motion.  Skin:    General: Skin is warm and dry.     Capillary Refill: Capillary refill takes less than 2 seconds.  Neurological:     General: No focal deficit present.     Mental Status: She is alert and oriented to person, place, and time. Mental status is at baseline.  Psychiatric:        Mood and Affect: Mood normal.        Behavior: Behavior normal.        Thought Content: Thought content normal.        Judgment: Judgment normal.    Lab Results  Component Value Date   TSH 1.580 11/13/2019   Lab Results  Component Value Date   WBC 6.4 11/13/2019   HGB 14.2 11/13/2019   HCT 43.2 11/13/2019   MCV 86 11/13/2019   PLT 268 11/13/2019   Lab Results  Component Value Date   NA 139  11/13/2019   K 4.2 11/13/2019   CO2 24 11/13/2019   GLUCOSE 88 11/13/2019   BUN 11 11/13/2019   CREATININE 0.61 11/13/2019   BILITOT 0.4 11/13/2019   ALKPHOS 92 11/13/2019   AST 17 11/13/2019   ALT 17 11/13/2019   PROT 6.5 11/13/2019   ALBUMIN 4.3 11/13/2019   CALCIUM 9.1 11/13/2019   Lab Results  Component Value Date   CHOL 223 (H) 11/13/2019   Lab Results  Component Value Date   HDL 38 (L) 11/13/2019   Lab Results  Component Value Date   LDLCALC 155 (H) 11/13/2019   Lab Results  Component Value Date   TRIG 163 (H) 11/13/2019   Lab Results  Component Value Date   CHOLHDL 5.9 (H) 11/13/2019   No results found for: HGBA1C

## 2020-10-14 ENCOUNTER — Ambulatory Visit: Payer: BC Managed Care – PPO | Admitting: Pharmacist

## 2020-10-14 ENCOUNTER — Other Ambulatory Visit: Payer: Self-pay

## 2020-10-14 ENCOUNTER — Encounter: Payer: Self-pay | Admitting: Pharmacist

## 2020-10-14 VITALS — Wt 190.8 lb

## 2020-10-14 DIAGNOSIS — E669 Obesity, unspecified: Secondary | ICD-10-CM | POA: Diagnosis not present

## 2020-10-14 MED ORDER — SAXENDA 18 MG/3ML ~~LOC~~ SOPN: 3.0000 mg | PEN_INJECTOR | Freq: Every day | SUBCUTANEOUS | 2 refills | Status: DC

## 2020-10-14 NOTE — Progress Notes (Signed)
    10/14/2020 Name: Tamara Mendoza MRN: [id number] DOB: [birth year]   S:  55 yoF Presents for weightloss evaluation, education, and management.  Patient was referred and last seen by Primary Care Provider on 09/23/20.  Insurance coverage/medication affordability: bcbs     Patient reported dietary habits: Eats 3 meals/day Discussed meal planning options and Plate method for healthy eating Avoid sugary drinks and desserts Incorporate balanced protein, non starchy veggies, 1 serving of carbohydrate with each meal Increase water intake Increase physical activity as able   Patient-reported exercise habits: walks a lot at work; upcoming knee surgery within the next year  Patient reports adherence with medications. Current medications for weight loss: n/a      O:  Current weight: 190lbs  Lipid Panel     Component Value Date/Time   CHOL 223 (H) 11/13/2019 1028   TRIG 163 (H) 11/13/2019 1028   HDL 38 (L) 11/13/2019 1028   CHOLHDL 5.9 (H) 11/13/2019 1028   LDLCALC 155 (H) 11/13/2019 1028     A/P: -Healthy eating and meal planning discussed (healthy plate method)   -Patient to start [place] pending insurance -Will f/u dosing if covered -Patient denies personal and family history of thyroid/medullary cancer.    -Will f/u next week with PA saxenda   Written patient instructions provided.  Total time in face to face counseling 20 minutes.   Regina Eck, PharmD, BCPS Clinical Pharmacist, [place]  [facility] Phone [phone]

## 2020-11-04 ENCOUNTER — Encounter: Payer: Self-pay | Admitting: Orthopaedic Surgery

## 2020-11-04 ENCOUNTER — Other Ambulatory Visit: Payer: Self-pay

## 2020-11-04 ENCOUNTER — Ambulatory Visit (INDEPENDENT_AMBULATORY_CARE_PROVIDER_SITE_OTHER): Payer: BC Managed Care – PPO | Admitting: Orthopaedic Surgery

## 2020-11-04 DIAGNOSIS — M170 Bilateral primary osteoarthritis of knee: Secondary | ICD-10-CM | POA: Diagnosis not present

## 2020-11-04 MED ORDER — METHYLPREDNISOLONE ACETATE 40 MG/ML IJ SUSP
40.0000 mg | INTRAMUSCULAR | Status: AC | PRN
  Administered 2020-11-04: 40 mg via INTRA_ARTICULAR

## 2020-11-04 MED ORDER — BUPIVACAINE HCL 0.25 % IJ SOLN
4.0000 mL | INTRAMUSCULAR | Status: AC | PRN
  Administered 2020-11-04: 4 mL via INTRA_ARTICULAR

## 2020-11-04 NOTE — Progress Notes (Signed)
Office Visit Note   Patient: Tamara Mendoza           Date of Birth: 16-Jan-1966           MRN: [id number] Visit Date: 11/04/2020              Requested by: Loman Brooklyn, [place],  [place] [address] PCP: Loman Brooklyn, FNP   Assessment & Plan: Visit Diagnoses:  1. Bilateral primary osteoarthritis of knee     Plan: Knee injection performed she is able to ambulate better postinjection.  Return 3 months single standing [facility] x-ray of both knees on return.  Follow-Up Instructions: Return in about 3 months (around 02/03/2021).   Orders:  Orders Placed This Encounter  Procedures   Large Joint Inj   No orders of the defined types were placed in this encounter.     Procedures: Large Joint Inj: R knee on 11/04/2020 9:23 AM Indications: pain and joint swelling Details: 22 G 1.5 in needle, anterolateral approach  Arthrogram: No  Medications: 40 mg methylPREDNISolone acetate 40 MG/ML; 4 mL bupivacaine 0.25 % Outcome: tolerated well, no immediate complications Procedure, treatment alternatives, risks and benefits explained, specific risks discussed. Consent was given by the patient. Immediately prior to procedure a time out was called to verify the correct patient, procedure, equipment, support staff and site/side marked as required. Patient was prepped and draped in the usual sterile fashion.      Clinical Data: No additional findings.   Subjective: Chief Complaint  Patient presents with   Right Knee - Pain    HPI patient returns with recurrent problems with right knee osteoarthritis.  She has 2 mm medial joint space.  Last injection in April gave her more than a month of relief.  She has pain that shoots from her knee up toward her hips.  Ambulatory with a limp.  She denies associated back pain no chills or fever.  Patient works at [employer] and she is on her feet doing a lot of walking during her shift.  Review of Systems all other systems updated  unchanged from April 2022 office visit.   Objective: Vital Signs: Ht 5\' 2"  (1.575 m)   Wt 187 lb (84.8 kg)   BMI 34.20 kg/m   Physical Exam Constitutional:      Appearance: She is well-developed.  HENT:     Head: Normocephalic.     Right Ear: External ear normal.     Left Ear: External ear normal. There is no impacted cerumen.  Eyes:     Pupils: Pupils are equal, round, and reactive to light.  Neck:     Thyroid: No thyromegaly.     Trachea: No tracheal deviation.  Cardiovascular:     Rate and Rhythm: Normal rate.  Pulmonary:     Effort: Pulmonary effort is normal.  Abdominal:     Palpations: Abdomen is soft.  Musculoskeletal:     Cervical back: No rigidity.  Skin:    General: Skin is warm and dry.  Neurological:     Mental Status: She is alert and oriented to person, place, and time.  Psychiatric:        Behavior: Behavior normal.    Ortho Exam.  Crepitus with knee range of motion mild genu varum deformity.  Negative logroll of the hips.  Pulses are normal.  Specialty Comments:  No specialty comments available.  Imaging: No results found.   PMFS History: Patient Active Problem List  Diagnosis Date Noted   Bilateral primary osteoarthritis of knee 11/04/2020   Obesity (BMI 30.0-34.9) 09/09/2020   Arthritis 04/01/2020   Mixed hyperlipidemia    Difficulty sleeping 11/16/2019   Hot flashes 11/16/2019   Eczema of both hands 11/16/2019   Tobacco use 11/13/2019   Anxiety and depression 10/02/2019   Past Medical History:  Diagnosis Date   Anxiety and depression    Arthritis    Eczema    Mixed hyperlipidemia     Family History  Problem Relation Age of Onset   Hypertension Mother    Uterine cancer Mother    Cancer Father    Diabetes Sister    Diabetes Brother    Diabetes Sister     Past Surgical History:  Procedure Laterality Date   BIOPSY  04/05/2020   Procedure: BIOPSY;  Surgeon: Eloise Harman, DO;  [facility];  Service:  Endoscopy;;   BIOPSY  06/21/2020   Procedure: BIOPSY;  Surgeon: Irving Copas., MD;  Location: [facility];  Service: Gastroenterology;;   CESAREAN SECTION     COLONOSCOPY WITH PROPOFOL N/A 04/05/2020   Procedure: COLONOSCOPY WITH PROPOFOL;  Surgeon: Eloise Harman, DO;  [facility];  Service: Endoscopy;  Laterality: N/A;  11:00 ASA I/II, pt tested + 1/26   COLONOSCOPY WITH PROPOFOL N/A 06/21/2020   Procedure: COLONOSCOPY WITH PROPOFOL;  Surgeon: Rush Landmark Telford Nab., MD;  Location: [facility];  Service: Gastroenterology;  Laterality: N/A;   ENDOSCOPIC MUCOSAL RESECTION N/A 06/21/2020   Procedure: ENDOSCOPIC MUCOSAL RESECTION;  Surgeon: Rush Landmark Telford Nab., MD;  Location: [facility];  Service: Gastroenterology;  Laterality: N/A;   HEMOSTASIS CLIP PLACEMENT  06/21/2020   Procedure: HEMOSTASIS CLIP PLACEMENT;  Surgeon: Irving Copas., MD;  Location: [facility];  Service: Gastroenterology;;   POLYPECTOMY  04/05/2020   Procedure: POLYPECTOMY;  Surgeon: Eloise Harman, DO;  [facility];  Service: Endoscopy;;   POLYPECTOMY  06/21/2020   Procedure: POLYPECTOMY;  Surgeon: Irving Copas., MD;  Location: [facility];  Service: Gastroenterology;;   Social History   Occupational History   Not on file  Tobacco Use   Smoking status: Every Day    Packs/day: 0.25    Years: 30.00    Pack years: 7.50    Types: Cigarettes   Smokeless tobacco: Never  Vaping Use   Vaping Use: Never used  Substance and Sexual Activity   Alcohol use: Yes    Comment: occ   Drug use: No   Sexual activity: Not on file

## 2021-01-27 ENCOUNTER — Ambulatory Visit: Payer: BC Managed Care – PPO | Admitting: Family Medicine

## 2021-01-27 ENCOUNTER — Ambulatory Visit: Payer: BC Managed Care – PPO | Admitting: Orthopaedic Surgery

## 2021-01-27 ENCOUNTER — Encounter: Payer: Self-pay | Admitting: Family Medicine

## 2021-01-27 ENCOUNTER — Other Ambulatory Visit: Payer: Self-pay

## 2021-01-27 VITALS — BP 112/77 | HR 87 | Temp 97.8°F | Ht 62.0 in | Wt 188.8 lb

## 2021-01-27 DIAGNOSIS — J069 Acute upper respiratory infection, unspecified: Secondary | ICD-10-CM | POA: Diagnosis not present

## 2021-01-27 DIAGNOSIS — R051 Acute cough: Secondary | ICD-10-CM | POA: Diagnosis not present

## 2021-01-27 LAB — VERITOR FLU A/B WAIVED
Influenza A: NEGATIVE
Influenza B: NEGATIVE

## 2021-01-27 MED ORDER — ALBUTEROL SULFATE HFA 108 (90 BASE) MCG/ACT IN AERS: 2.0000 | INHALATION_SPRAY | Freq: Four times a day (QID) | RESPIRATORY_TRACT | 0 refills | Status: DC | PRN

## 2021-01-27 NOTE — Progress Notes (Signed)
Assessment & Plan:  1-2. Viral URI/ Acute Cough - symptom management with tylenol, expectorants, and decongestants OTC - albuterol inhaler as needed - Veritor Flu A/B Waived, negative - Novel Coronavirus, NAA ([facility])   Follow up plan: Return if symptoms worsen or fail to improve.  Lucile Crater, NP Student  I personally was present during the history, physical exam, and medical decision-making activities of this service and have verified that the service and findings are accurately documented in the nurse practitioner student's note.  Hendricks Limes, MSN, APRN, FNP-C [facility]  Subjective:   Patient ID: Tamara Mendoza, female    DOB: 07/29/1965, 55 y.o.   MRN: [id number]  HPI: Tamara Mendoza is a 55 y.o. female presenting on 01/27/2021 for Cough, Nasal Congestion, and Diarrhea (X 2 days )  She states that for the last 2 days she has had cough, congestion, and diarrhea. She works at [employer] and has been around many potentially sick people. She states she has been coughing hard and is is occasionally hard to catch her breath. She has taken robitussin to help with the cough.    ROS: Negative unless specifically indicated above in HPI.   Relevant past medical history reviewed and updated as indicated.   Allergies and medications reviewed and updated.   Current Outpatient Medications:    albuterol (VENTOLIN HFA) 108 (90 Base) MCG/ACT inhaler, Inhale 2 puffs into the lungs every 6 (six) hours as needed., Disp: 18 g, Rfl: 0   Calcium Carb-Cholecalciferol (CALCIUM + D3 PO), Take 1 tablet by mouth in the morning., Disp: , Rfl:    Cyanocobalamin (VITAMIN B12 PO), Take 2 tablets by mouth in the morning. Gummies, Disp: , Rfl:    escitalopram (LEXAPRO) 10 MG tablet, Take 1 tablet (10 mg total) by mouth daily., Disp: 90 tablet, Rfl: 1   ibuprofen (ADVIL) 600 MG tablet, Take 600 mg by mouth daily as needed (pain.)., Disp: , Rfl:    Liraglutide -Weight Management  (SAXENDA) 18 MG/3ML SOPN, Inject 3 mg into the skin daily., Disp: 15 mL, Rfl: 2   meloxicam (MOBIC) 15 MG tablet, Take 1 tablet (15 mg total) by mouth daily., Disp: 90 tablet, Rfl: 1   mometasone (NASONEX) 50 MCG/ACT nasal spray, Place 2 sprays into the nose daily., Disp: 1 each, Rfl: 12   triamcinolone cream (KENALOG) 0.1 %, Apply 1 application topically 2 (two) times daily. (Patient taking differently: Apply 1 application topically daily.), Disp: 80 g, Rfl: 2  No Known Allergies  Objective:   BP 112/77    Pulse 87    Temp 97.8 F (36.6 C) (Temporal)    Ht 5\' 2"  (1.575 m)    Wt 85.6 kg    SpO2 94%    BMI 34.53 kg/m    Physical Exam Vitals reviewed.  Constitutional:      General: She is not in acute distress.    Appearance: Normal appearance. She is not ill-appearing, toxic-appearing or diaphoretic.  HENT:     Head: Normocephalic and atraumatic.     Nose: Nose normal.     Mouth/Throat:     Mouth: Mucous membranes are moist.     Pharynx: Oropharynx is clear.  Eyes:     Extraocular Movements: Extraocular movements intact.     Conjunctiva/sclera: Conjunctivae normal.     Pupils: Pupils are equal, round, and reactive to light.  Cardiovascular:     Rate and Rhythm: Normal rate and regular rhythm.     Pulses:  Normal pulses.     Heart sounds: Normal heart sounds.  Pulmonary:     Effort: Pulmonary effort is normal. No respiratory distress.     Breath sounds: Normal breath sounds. No wheezing or rhonchi.  Chest:     Chest wall: No tenderness.  Abdominal:     General: There is no distension.     Palpations: Abdomen is soft. There is no mass.  Musculoskeletal:        General: Normal range of motion.     Cervical back: Normal range of motion.  Skin:    General: Skin is warm and dry.  Neurological:     General: No focal deficit present.     Mental Status: She is alert and oriented to person, place, and time.     Motor: No weakness.     Gait: Gait normal.  Psychiatric:         Mood and Affect: Mood normal.        Behavior: Behavior normal.        Thought Content: Thought content normal.        Judgment: Judgment normal.

## 2021-01-27 NOTE — Patient Instructions (Signed)
Tylenol Cold & Flu day and nighttime

## 2021-01-28 LAB — SARS-COV-2, NAA 2 DAY TAT

## 2021-01-28 LAB — NOVEL CORONAVIRUS, NAA: SARS-CoV-2, NAA: NOT DETECTED

## 2021-02-03 ENCOUNTER — Ambulatory Visit (INDEPENDENT_AMBULATORY_CARE_PROVIDER_SITE_OTHER): Payer: BC Managed Care – PPO | Admitting: Orthopaedic Surgery

## 2021-02-03 ENCOUNTER — Encounter: Payer: Self-pay | Admitting: Orthopaedic Surgery

## 2021-02-03 ENCOUNTER — Other Ambulatory Visit: Payer: Self-pay

## 2021-02-03 ENCOUNTER — Ambulatory Visit: Payer: Self-pay

## 2021-02-03 VITALS — Ht 62.0 in | Wt 188.0 lb

## 2021-02-03 DIAGNOSIS — M1711 Unilateral primary osteoarthritis, right knee: Secondary | ICD-10-CM | POA: Diagnosis not present

## 2021-02-03 DIAGNOSIS — M1712 Unilateral primary osteoarthritis, left knee: Secondary | ICD-10-CM

## 2021-02-03 DIAGNOSIS — M170 Bilateral primary osteoarthritis of knee: Secondary | ICD-10-CM

## 2021-02-03 NOTE — Progress Notes (Signed)
Office Visit Note   Patient: Tamara Mendoza           Date of Birth: October 10, 1965           MRN: [id number] Visit Date: 02/03/2021              Requested by: Loman Brooklyn, [place],  [place] [address] PCP: Loman Brooklyn, FNP   Assessment & Plan: Visit Diagnoses:  1. Bilateral primary osteoarthritis of knee             Right knee worse than left.  Plan: Patient like to proceed with right total knee arthroplasty.  Her pain is progressed on anti-inflammatories with ibuprofen and later Aleve, Mobic all not effective she has had intra-articular injections x2 in the last year and has pain with daily activities and pain at night.  Bone-on-bone changes subchondral cyst formation marginal osteophytes and subchondral sclerosis present bone-on-bone medial compartment both knees.  She like proceed with right total knee arthroplasty.  We discussed abductor block, spinal anesthesia, postop CPM.  Usual overnight stay in the hospital under observation status.  Home health PT x2 weeks and then outpatient therapy.  Questions were elicited and answered, she requests we proceed.  Follow-Up Instructions: No follow-ups on file.   Orders:  Orders Placed This Encounter  Procedures   XR Knee 1-2 Views Right   No orders of the defined types were placed in this encounter.     Procedures: No procedures performed   Clinical Data: No additional findings.   Subjective: Chief Complaint  Patient presents with   Left Knee - Pain   Right Knee - Pain    HPI 55 year old female returns with bilateral knee pain worse on the right knee than left knee.  She had previous injection in April 2022 which worked well.  She been on anti-inflammatories and states now they are no longer effective and injection that was done in September only lasted a short time with recurrence of pain primarily medial more than lateral.  Standing x-rays show bone-on-bone changes medial compartment with slight varus  both knees and bilateral lateral osteophyte formation with patellofemoral degenerative changes.  She does have some left knee symptoms but right knee is much more painful than the left currently.  Other mild medical problems include slight overweight, mixed hyperlipidemia, tobacco use.  Anxiety and depression.    Review of Systems 14 point system otherwise noncontributory to HPI.   Objective: Vital Signs: Ht 5\' 2"  (1.575 m)    Wt 188 lb (85.3 kg)    BMI 34.39 kg/m   Physical Exam Constitutional:      Appearance: She is well-developed.  HENT:     Head: Normocephalic.     Right Ear: External ear normal.     Left Ear: External ear normal. There is no impacted cerumen.  Eyes:     Pupils: Pupils are equal, round, and reactive to light.  Neck:     Thyroid: No thyromegaly.     Trachea: No tracheal deviation.  Cardiovascular:     Rate and Rhythm: Normal rate.  Pulmonary:     Effort: Pulmonary effort is normal.  Abdominal:     Palpations: Abdomen is soft.  Musculoskeletal:     Cervical back: No rigidity.  Skin:    General: Skin is warm and dry.  Neurological:     Mental Status: She is alert and oriented to person, place, and time.  Psychiatric:  Behavior: Behavior normal.    Ortho Exam patient has bilateral knee crepitus with range of motion medial more than lateral joint line tenderness worse on the right knee and the left knee.  She does reach full extension flexes past 100 degrees both right and left.  Negative logroll hips no sciatic notch tenderness anterior tib EHL is strong distal pulses are normal no plantar foot lesions.  Specialty Comments:  No specialty comments available.  Imaging: No results found.   PMFS History: Patient Active Problem List   Diagnosis Date Noted   Bilateral primary osteoarthritis of knee 11/04/2020   Obesity (BMI 30.0-34.9) 09/09/2020   Arthritis 04/01/2020   Mixed hyperlipidemia    Difficulty sleeping 11/16/2019   Hot flashes  11/16/2019   Eczema of both hands 11/16/2019   Tobacco use 11/13/2019   Anxiety and depression 10/02/2019   Past Medical History:  Diagnosis Date   Anxiety and depression    Arthritis    Eczema    Mixed hyperlipidemia     Family History  Problem Relation Age of Onset   Hypertension Mother    Uterine cancer Mother    Cancer Father    Diabetes Sister    Diabetes Brother    Diabetes Sister     Past Surgical History:  Procedure Laterality Date   BIOPSY  04/05/2020   Procedure: BIOPSY;  Surgeon: Eloise Harman, DO;  [facility];  Service: Endoscopy;;   BIOPSY  06/21/2020   Procedure: BIOPSY;  Surgeon: Irving Copas., MD;  Location: [facility];  Service: Gastroenterology;;   CESAREAN SECTION     COLONOSCOPY WITH PROPOFOL N/A 04/05/2020   Procedure: COLONOSCOPY WITH PROPOFOL;  Surgeon: Eloise Harman, DO;  [facility];  Service: Endoscopy;  Laterality: N/A;  11:00 ASA I/II, pt tested + 1/26   COLONOSCOPY WITH PROPOFOL N/A 06/21/2020   Procedure: COLONOSCOPY WITH PROPOFOL;  Surgeon: Rush Landmark Telford Nab., MD;  Location: [facility];  Service: Gastroenterology;  Laterality: N/A;   ENDOSCOPIC MUCOSAL RESECTION N/A 06/21/2020   Procedure: ENDOSCOPIC MUCOSAL RESECTION;  Surgeon: Rush Landmark Telford Nab., MD;  Location: [facility];  Service: Gastroenterology;  Laterality: N/A;   HEMOSTASIS CLIP PLACEMENT  06/21/2020   Procedure: HEMOSTASIS CLIP PLACEMENT;  Surgeon: Irving Copas., MD;  Location: [facility];  Service: Gastroenterology;;   POLYPECTOMY  04/05/2020   Procedure: POLYPECTOMY;  Surgeon: Eloise Harman, DO;  [facility];  Service: Endoscopy;;   POLYPECTOMY  06/21/2020   Procedure: POLYPECTOMY;  Surgeon: Irving Copas., MD;  Location: [facility];  Service: Gastroenterology;;   Social History   Occupational History   Not on file  Tobacco Use   Smoking status: Every Day    Packs/day: 0.25    Years: 30.00     Pack years: 7.50    Types: Cigarettes   Smokeless tobacco: Never  Vaping Use   Vaping Use: Never used  Substance and Sexual Activity   Alcohol use: Yes    Comment: occ   Drug use: No   Sexual activity: Not on file

## 2021-02-11 ENCOUNTER — Other Ambulatory Visit: Payer: Self-pay | Admitting: Family Medicine

## 2021-02-11 DIAGNOSIS — Z1231 Encounter for screening mammogram for malignant neoplasm of breast: Secondary | ICD-10-CM

## 2021-02-23 ENCOUNTER — Ambulatory Visit
Admission: RE | Admit: 2021-02-23 | Discharge: 2021-02-23 | Disposition: A | Discharge status: Home / Self Care | Payer: BC Managed Care – PPO | Source: Ambulatory Visit | Attending: Family Medicine | Admitting: Family Medicine

## 2021-02-23 DIAGNOSIS — Z1231 Encounter for screening mammogram for malignant neoplasm of breast: Secondary | ICD-10-CM

## 2021-03-01 ENCOUNTER — Other Ambulatory Visit: Payer: Self-pay | Admitting: Family Medicine

## 2021-03-01 DIAGNOSIS — F419 Anxiety disorder, unspecified: Secondary | ICD-10-CM

## 2021-03-17 ENCOUNTER — Encounter: Payer: BC Managed Care – PPO | Admitting: Family Medicine

## 2021-04-28 ENCOUNTER — Encounter: Payer: BC Managed Care – PPO | Admitting: Family Medicine

## 2021-06-30 ENCOUNTER — Encounter: Payer: Self-pay | Admitting: Family Medicine

## 2021-06-30 ENCOUNTER — Ambulatory Visit (INDEPENDENT_AMBULATORY_CARE_PROVIDER_SITE_OTHER): Payer: BC Managed Care – PPO | Admitting: Family Medicine

## 2021-06-30 VITALS — BP 111/77 | HR 83 | Temp 97.4°F | Ht 62.0 in | Wt 192.8 lb

## 2021-06-30 DIAGNOSIS — E66811 Obesity, class 1: Secondary | ICD-10-CM

## 2021-06-30 DIAGNOSIS — F419 Anxiety disorder, unspecified: Secondary | ICD-10-CM | POA: Diagnosis not present

## 2021-06-30 DIAGNOSIS — Z720 Tobacco use: Secondary | ICD-10-CM

## 2021-06-30 DIAGNOSIS — Z0000 Encounter for general adult medical examination without abnormal findings: Secondary | ICD-10-CM | POA: Diagnosis not present

## 2021-06-30 DIAGNOSIS — F32A Depression, unspecified: Secondary | ICD-10-CM

## 2021-06-30 DIAGNOSIS — E782 Mixed hyperlipidemia: Secondary | ICD-10-CM

## 2021-06-30 DIAGNOSIS — M170 Bilateral primary osteoarthritis of knee: Secondary | ICD-10-CM | POA: Diagnosis not present

## 2021-06-30 DIAGNOSIS — E669 Obesity, unspecified: Secondary | ICD-10-CM

## 2021-06-30 DIAGNOSIS — Z0001 Encounter for general adult medical examination with abnormal findings: Secondary | ICD-10-CM

## 2021-06-30 MED ORDER — MELOXICAM 15 MG PO TABS: 15.0000 mg | ORAL_TABLET | Freq: Every day | ORAL | 3 refills | Status: DC

## 2021-06-30 MED ORDER — ESCITALOPRAM OXALATE 10 MG PO TABS: 10.0000 mg | ORAL_TABLET | Freq: Every day | ORAL | 2 refills | Status: DC

## 2021-06-30 NOTE — Patient Instructions (Addendum)
Here is a guide to help [place] find out which weight loss medications will be covered by your insurance plan.  Please check out this web site  [email or web address] and follow the 3 simple steps.   There is also a phone number you can call if you do not have access to the Internet. [phone] (Monday- Friday 8am-8pm)  [employer] provides coverage information for more than 80% of the inquiries submitted!!   [facility] - call to schedule your repeat colonoscopy [place], [place] [address] [phone]

## 2021-06-30 NOTE — Progress Notes (Signed)
Assessment & Plan:  1. Well adult exam Preventive health education provided.  Patient to call and schedule her colonoscopy as she is due for her one year follow-up screening. - CBC with Differential/Platelet - CMP14+EGFR - Lipid panel  2. Anxiety and depression Uncontrolled.  Discussed with patient Lexapro does not work if she only takes it as needed.  She needs to take this every day.  She is agreeable and will start. - escitalopram (LEXAPRO) 10 MG tablet; Take 1 tablet (10 mg total) by mouth daily.  Dispense: 30 tablet; Refill: 2  3. Bilateral primary osteoarthritis of knee Patient is working on getting approved for bilateral knee replacements in [place]. - meloxicam (MOBIC) 15 MG tablet; Take 1 tablet (15 mg total) by mouth daily.  Dispense: 90 tablet; Refill: 3  4. Mixed hyperlipidemia - Lipid panel  5. Obesity (BMI 30.0-34.9) Encouraged healthy eating and exercise, however she is limited due to her knee pain.  Advised patient to go to [email or web address] to assist in figuring out which weight loss medication her insurance covers.  6. Tobacco use Encouraged smoking cessation.   Follow-up: Return in about 6 weeks (around 08/11/2021) for Anxiety.   Hendricks Limes, MSN, APRN, FNP-C [facility]  Subjective:  Patient ID: Tamara Mendoza, female    DOB: 03-04-1965  Age: 56 y.o. MRN: [id number]  Patient Care Team: Loman Brooklyn, FNP as PCP - General (Family Medicine) Eloise Harman, DO as Consulting Physician (Internal Medicine) Lavera Guise, [facility] as Pharmacist (Family Medicine)   CC:  Chief Complaint  Patient presents with   Gynecologic Exam   in grown hair    Patient noticed an in grown hair on the top of her butt crack x 2-3 weeks ago.    [name] presents for her annual physical.  Occupation: [employer], Marital status: divorced, Substance use: denies Diet: none, Exercise: none Last eye exam: few years ago Last dental exam: last  month Last colonoscopy: 06/21/2020 with recommended repeat in 1 year Last mammogram: 02/23/2021 Last pap smear: 11/13/2019 with recommended repeat in 5 years Immunizations: Flu Vaccine: declined Tdap Vaccine: declined  Shingrix Vaccine: up to date  COVID-19 Vaccine: declined  Anxiety/Depression: Patient has Lexapro 10 mg that she takes every now and then.  She does report when she was taking it consistently she was feeling much better.  She has just been really frustrated recently with work and her need for bilateral knee replacements.  She was scheduled to have this completed in [place] and was notified her insurance would not pay.  She reports she has to go out of state to have her knee replacements.  She is trying to get everything set up for [place].     06/30/2021    9:00 AM 01/27/2021    2:26 PM 09/23/2020   10:23 AM  Depression screen PHQ 2/9  Decreased Interest _0 Down, Depressed, Hopeless 1 0 2  PHQ - 2 Score _1 Altered sleeping _2 Tired, decreased energy _3 Change in appetite _4 Feeling bad or failure about yourself  _5 Trouble concentrating 1 0 1  Moving slowly or fidgety/restless 1 0 0  Suicidal thoughts 0 0 0  PHQ-9 Score _6 Difficult doing work/chores Somewhat difficult Not difficult at all Not difficult at all      06/30/2021    9:02 AM 01/27/2021  2:26 PM 09/23/2020   10:23 AM 09/09/2020    1:06 PM  GAD 7 : Generalized Anxiety Score  Nervous, Anxious, on Edge 2 0 1 2  Control/stop worrying _0 Worry too much - different things _1 Trouble relaxing _2 Restless 1 0 1 1  Easily annoyed or irritable _3 Afraid - awful might happen 1 0 0 1  Total GAD 7 Score _4 Anxiety Difficulty Somewhat difficult Not difficult at all Not difficult at all     Review of Systems  Constitutional:  Negative for chills, diaphoresis, fever, malaise/fatigue and weight loss.  HENT:  Negative for congestion, ear discharge,  ear pain, nosebleeds, sinus pain, sore throat and tinnitus.   Eyes:  Negative for blurred vision, double vision, pain, discharge and redness.  Respiratory:  Negative for cough, shortness of breath and wheezing.   Cardiovascular:  Negative for chest pain, palpitations and leg swelling.  Gastrointestinal:  Negative for abdominal pain, constipation, diarrhea, heartburn, nausea and vomiting.  Genitourinary:  Negative for dysuria, frequency and urgency.  Musculoskeletal:  Positive for joint pain. Negative for myalgias.  Skin:  Negative for rash.  Neurological:  Negative for dizziness, seizures, weakness and headaches.  Psychiatric/Behavioral:  Positive for depression. Negative for substance abuse and suicidal ideas. The patient is nervous/anxious and has insomnia (on days she works 2nd shift).     Current Outpatient Medications:    albuterol (VENTOLIN HFA) 108 (90 Base) MCG/ACT inhaler, Inhale 2 puffs into the lungs every 6 (six) hours as needed. (Patient not taking: Reported on 06/30/2021), Disp: 18 g, Rfl: 0   Calcium Carb-Cholecalciferol (CALCIUM + D3 PO), Take 1 tablet by mouth in the morning. (Patient not taking: Reported on 06/30/2021), Disp: , Rfl:    Cyanocobalamin (VITAMIN B12 PO), Take 2 tablets by mouth in the morning. Gummies (Patient not taking: Reported on 06/30/2021), Disp: , Rfl:    escitalopram (LEXAPRO) 10 MG tablet, Take 1 tablet by mouth once daily (Patient not taking: Reported on 06/30/2021), Disp: 90 tablet, Rfl: 0   ibuprofen (ADVIL) 600 MG tablet, Take 600 mg by mouth daily as needed (pain.). (Patient not taking: Reported on 06/30/2021), Disp: , Rfl:    meloxicam (MOBIC) 15 MG tablet, Take 1 tablet (15 mg total) by mouth daily. (Patient not taking: Reported on 06/30/2021), Disp: 90 tablet, Rfl: 1   mometasone (NASONEX) 50 MCG/ACT nasal spray, Place 2 sprays into the nose daily. (Patient not taking: Reported on 06/30/2021), Disp: 1 each, Rfl: 12   triamcinolone cream (KENALOG) 0.1 %,  Apply 1 application topically 2 (two) times daily. (Patient not taking: Reported on 06/30/2021), Disp: 80 g, Rfl: 2  No Known Allergies  Past Medical History:  Diagnosis Date   Anxiety and depression    Arthritis    Eczema    Mixed hyperlipidemia     Past Surgical History:  Procedure Laterality Date   BIOPSY  04/05/2020   Procedure: BIOPSY;  Surgeon: Eloise Harman, DO;  [facility];  Service: Endoscopy;;   BIOPSY  06/21/2020   Procedure: BIOPSY;  Surgeon: Irving Copas., MD;  Location: [facility];  Service: Gastroenterology;;   CESAREAN SECTION     COLONOSCOPY WITH PROPOFOL N/A 04/05/2020   Procedure: COLONOSCOPY WITH PROPOFOL;  Surgeon: Eloise Harman, DO;  [facility];  Service: Endoscopy;  Laterality: N/A;  11:00 ASA I/II, pt tested +  1/26   COLONOSCOPY WITH PROPOFOL N/A 06/21/2020   Procedure: COLONOSCOPY WITH PROPOFOL;  Surgeon: Rush Landmark Telford Nab., MD;  Location: [facility];  Service: Gastroenterology;  Laterality: N/A;   ENDOSCOPIC MUCOSAL RESECTION N/A 06/21/2020   Procedure: ENDOSCOPIC MUCOSAL RESECTION;  Surgeon: Rush Landmark Telford Nab., MD;  Location: [facility];  Service: Gastroenterology;  Laterality: N/A;   HEMOSTASIS CLIP PLACEMENT  06/21/2020   Procedure: HEMOSTASIS CLIP PLACEMENT;  Surgeon: Irving Copas., MD;  Location: [facility];  Service: Gastroenterology;;   POLYPECTOMY  04/05/2020   Procedure: POLYPECTOMY;  Surgeon: Eloise Harman, DO;  [facility];  Service: Endoscopy;;   POLYPECTOMY  06/21/2020   Procedure: POLYPECTOMY;  Surgeon: Irving Copas., MD;  Location: [facility];  Service: Gastroenterology;;    Family History  Problem Relation Age of Onset   Hypertension Mother    Uterine cancer Mother    Cancer Father    Diabetes Sister    Diabetes Sister    Diabetes Brother    Breast cancer Neg Hx     Social History   Socioeconomic History   Marital status: Single    Spouse  name: Not on file   Number of children: Not on file   Years of education: Not on file   Highest education level: Not on file  Occupational History   Not on file  Tobacco Use   Smoking status: Every Day    Packs/day: 0.25    Years: 30.00    Pack years: 7.50    Types: Cigarettes   Smokeless tobacco: Never  Vaping Use   Vaping Use: Never used  Substance and Sexual Activity   Alcohol use: Yes    Comment: occ   Drug use: No   Sexual activity: Not on file  Other Topics Concern   Not on file  Social History Narrative   Not on file   Social Determinants of Health   Financial Resource Strain: Not on file  Food Insecurity: Not on file  Transportation Needs: Not on file  Physical Activity: Not on file  Stress: Not on file  Social Connections: Not on file  Intimate Partner Violence: Not on file      Objective:    BP 111/77   Pulse 83   Temp (!) 97.4 F (36.3 C) (Temporal)   Ht _0  (1.575 m)   Wt 192 lb 12.8 oz (87.5 kg)   SpO2 96%   BMI 35.26 kg/m   Wt Readings from Last 3 Encounters:  06/30/21 192 lb 12.8 oz (87.5 kg)  02/03/21 188 lb (85.3 kg)  01/27/21 188 lb 12.8 oz (85.6 kg)    Physical Exam Vitals reviewed.  Constitutional:      General: She is not in acute distress.    Appearance: Normal appearance. She is obese. She is not ill-appearing, toxic-appearing or diaphoretic.  HENT:     Head: Normocephalic and atraumatic.     Right Ear: Tympanic membrane, ear canal and external ear normal. There is no impacted cerumen.     Left Ear: Tympanic membrane, ear canal and external ear normal. There is no impacted cerumen.     Nose: Nose normal. No congestion or rhinorrhea.     Mouth/Throat:     Mouth: Mucous membranes are moist.     Pharynx: Oropharynx is clear. No oropharyngeal exudate or posterior oropharyngeal erythema.  Eyes:     General: No scleral icterus.       Right eye: No discharge.  Left eye: No discharge.     Conjunctiva/sclera: Conjunctivae  normal.     Pupils: Pupils are equal, round, and reactive to light.  Cardiovascular:     Rate and Rhythm: Normal rate and regular rhythm.     Heart sounds: Normal heart sounds. No murmur heard.   No friction rub. No gallop.  Pulmonary:     Effort: Pulmonary effort is normal. No respiratory distress.     Breath sounds: Normal breath sounds. No stridor. No wheezing, rhonchi or rales.  Abdominal:     General: Abdomen is flat. Bowel sounds are normal. There is no distension.     Palpations: Abdomen is soft. There is no mass.     Tenderness: There is no abdominal tenderness. There is no guarding or rebound.     Hernia: No hernia is present.  Musculoskeletal:        General: Normal range of motion.     Cervical back: Normal range of motion and neck supple. No rigidity. No muscular tenderness.  Lymphadenopathy:     Cervical: No cervical adenopathy.  Skin:    General: Skin is warm and dry.     Capillary Refill: Capillary refill takes less than 2 seconds.  Neurological:     General: No focal deficit present.     Mental Status: She is alert and oriented to person, place, and time. Mental status is at baseline.  Psychiatric:        Mood and Affect: Mood normal.        Behavior: Behavior normal.        Thought Content: Thought content normal.        Judgment: Judgment normal.    Lab Results  Component Value Date   TSH 1.580 11/13/2019   Lab Results  Component Value Date   WBC 6.4 11/13/2019   HGB 14.2 11/13/2019   HCT 43.2 11/13/2019   MCV 86 11/13/2019   PLT 268 11/13/2019   Lab Results  Component Value Date   NA 139 11/13/2019   K 4.2 11/13/2019   CO2 24 11/13/2019   GLUCOSE 88 11/13/2019   BUN 11 11/13/2019   CREATININE 0.61 11/13/2019   BILITOT 0.4 11/13/2019   ALKPHOS 92 11/13/2019   AST 17 11/13/2019   ALT 17 11/13/2019   PROT 6.5 11/13/2019   ALBUMIN 4.3 11/13/2019   CALCIUM 9.1 11/13/2019   Lab Results  Component Value Date   CHOL 223 (H) 11/13/2019   Lab  Results  Component Value Date   HDL 38 (L) 11/13/2019   Lab Results  Component Value Date   LDLCALC 155 (H) 11/13/2019   Lab Results  Component Value Date   TRIG 163 (H) 11/13/2019   Lab Results  Component Value Date   CHOLHDL 5.9 (H) 11/13/2019   No results found for: HGBA1C

## 2021-07-01 LAB — LIPID PANEL
Chol/HDL Ratio: 5 ratio — ABNORMAL HIGH (ref 0.0–4.4)
Cholesterol, Total: 232 mg/dL — ABNORMAL HIGH (ref 100–199)
HDL: 46 mg/dL (ref >39)
LDL Chol Calc (NIH): 164 mg/dL — ABNORMAL HIGH (ref 0–99)
Triglycerides: 120 mg/dL (ref 0–149)
VLDL Cholesterol Cal: 22 mg/dL (ref 5–40)

## 2021-07-01 LAB — CBC WITH DIFFERENTIAL/PLATELET
Basophils Absolute: 0.1 10*3/uL (ref 0.0–0.2)
Basos: 1 %
EOS (ABSOLUTE): 0.1 10*3/uL (ref 0.0–0.4)
Eos: 1 %
Hematocrit: 40.7 % (ref 34.0–46.6)
Hemoglobin: 13.5 g/dL (ref 11.1–15.9)
Immature Grans (Abs): 0 10*3/uL (ref 0.0–0.1)
Immature Granulocytes: 0 %
Lymphocytes Absolute: 1.7 10*3/uL (ref 0.7–3.1)
Lymphs: 26 %
MCH: 28.2 pg (ref 26.6–33.0)
MCHC: 33.2 g/dL (ref 31.5–35.7)
MCV: 85 fL (ref 79–97)
Monocytes Absolute: 0.7 10*3/uL (ref 0.1–0.9)
Monocytes: 10 %
Neutrophils Absolute: 3.9 10*3/uL (ref 1.4–7.0)
Neutrophils: 62 %
Platelets: 292 10*3/uL (ref 150–450)
RBC: 4.78 x10E6/uL (ref 3.77–5.28)
RDW: 12.6 % (ref 11.7–15.4)
WBC: 6.4 10*3/uL (ref 3.4–10.8)

## 2021-07-01 LAB — CMP14+EGFR
ALT: 19 IU/L (ref 0–32)
AST: 18 IU/L (ref 0–40)
Albumin/Globulin Ratio: 1.6 (ref 1.2–2.2)
Albumin: 4.1 g/dL (ref 3.8–4.9)
Alkaline Phosphatase: 93 IU/L (ref 44–121)
BUN/Creatinine Ratio: 25 — ABNORMAL HIGH (ref 9–23)
BUN: 15 mg/dL (ref 6–24)
Bilirubin Total: 0.6 mg/dL (ref 0.0–1.2)
CO2: 23 mmol/L (ref 20–29)
Calcium: 9.3 mg/dL (ref 8.7–10.2)
Chloride: 102 mmol/L (ref 96–106)
Creatinine, Ser: 0.61 mg/dL (ref 0.57–1.00)
Globulin, Total: 2.5 g/dL (ref 1.5–4.5)
Glucose: 91 mg/dL (ref 70–99)
Potassium: 4.3 mmol/L (ref 3.5–5.2)
Sodium: 139 mmol/L (ref 134–144)
Total Protein: 6.6 g/dL (ref 6.0–8.5)
eGFR: 104 mL/min/{1.73_m2} (ref >59)

## 2021-08-11 ENCOUNTER — Ambulatory Visit: Payer: BC Managed Care – PPO | Admitting: Family Medicine

## 2021-08-11 ENCOUNTER — Encounter: Payer: Self-pay | Admitting: Family Medicine

## 2021-08-11 VITALS — BP 113/78 | HR 75 | Temp 97.7°F | Ht 62.0 in | Wt 191.0 lb

## 2021-08-11 DIAGNOSIS — E669 Obesity, unspecified: Secondary | ICD-10-CM | POA: Diagnosis not present

## 2021-08-11 DIAGNOSIS — F32A Depression, unspecified: Secondary | ICD-10-CM | POA: Diagnosis not present

## 2021-08-11 DIAGNOSIS — F419 Anxiety disorder, unspecified: Secondary | ICD-10-CM | POA: Diagnosis not present

## 2021-08-11 NOTE — Patient Instructions (Signed)
Here is a guide to help us find out which weight loss medications will be covered by your insurance plan.  Please check out this web site  [email or web address] and follow the 3 simple steps.   There is also a phone number you can call if you do not have access to the Internet. [phone] (Monday- Friday 8am-8pm)  [employer] provides coverage information for more than 80% of the inquiries submitted!!  

## 2021-08-11 NOTE — Progress Notes (Signed)
Assessment & Plan:  1. Anxiety and depression Well controlled on current regimen.   2. Obesity (BMI 30.0-34.9) Encouraged to look at [email or web address] and see if insurance covers any weight loss medications.   Return in about 11 months (around 07/01/2022) for annual physical.  Hendricks Limes, MSN, APRN, FNP-C [facility]  Subjective:    Patient ID: Tamara Mendoza, female    DOB: 07/12/1965, 56 y.o.   MRN: [id number]  Patient Care Team: Loman Brooklyn, FNP as PCP - General (Family Medicine) Eloise Harman, DO as Consulting Physician (Internal Medicine) Lavera Guise, [facility] as Pharmacist (Family Medicine)   Chief Complaint:  Chief Complaint  Patient presents with   Anxiety    6 week follow up- patient states it is the same     HPI: Tamara Mendoza is a 56 y.o. female presenting on 08/11/2021 for Anxiety (6 week follow up- patient states it is the same/)  Anxiety/Depression: patient resumed Lexapro daily ~6 weeks ago. She reports today she does feel she has had some improvement but feels her remaining symptoms are due to the frustration of pain due to needing a knee replacement. She does not desire a dose change at this time.     08/11/2021    9:49 AM 06/30/2021    9:00 AM 01/27/2021    2:26 PM  Depression screen PHQ 2/9  Decreased Interest _0 Down, Depressed, Hopeless 1 1 0  PHQ - 2 Score _1 Altered sleeping _2 Tired, decreased energy _3 Change in appetite _4 Feeling bad or failure about yourself  0 1 2  Trouble concentrating 1 1 0  Moving slowly or fidgety/restless 0 1 0  Suicidal thoughts 0 0 0  PHQ-9 Score _5 Difficult doing work/chores Somewhat difficult Somewhat difficult Not difficult at all      08/11/2021    9:49 AM 06/30/2021    9:02 AM 01/27/2021    2:26 PM 09/23/2020   10:23 AM  GAD 7 : Generalized Anxiety Score  Nervous, Anxious, on Edge 1 2 0 1  Control/stop worrying _6 Worry too much - different  things _7 Trouble relaxing _8 Restless 1 1 0 1  Easily annoyed or irritable _9 Afraid - awful might happen 1 1 0 0  Total GAD 7 Score _10 Anxiety Difficulty Somewhat difficult Somewhat difficult Not difficult at all Not difficult at all   New complaints: None   Social history:  Relevant past medical, surgical, family and social history reviewed and updated as indicated. Interim medical history since our last visit reviewed.  Allergies and medications reviewed and updated.  DATA REVIEWED: CHART IN EPIC  ROS: Negative unless specifically indicated above in HPI.    Current Outpatient Medications:    escitalopram (LEXAPRO) 10 MG tablet, Take 1 tablet (10 mg total) by mouth daily., Disp: 30 tablet, Rfl: 2   meloxicam (MOBIC) 15 MG tablet, Take 1 tablet (15 mg total) by mouth daily., Disp: 90 tablet, Rfl: 3   No Known Allergies Past Medical History:  Diagnosis Date   Anxiety and depression    Arthritis    Eczema    Mixed hyperlipidemia     Past Surgical History:  Procedure Laterality Date   BIOPSY  04/05/2020   Procedure: BIOPSY;  Surgeon: Eloise Harman, DO;  [facility];  Service: Endoscopy;;   BIOPSY  06/21/2020   Procedure: BIOPSY;  Surgeon: Irving Copas., MD;  Location: [facility];  Service: Gastroenterology;;   CESAREAN SECTION     COLONOSCOPY WITH PROPOFOL N/A 04/05/2020   Procedure: COLONOSCOPY WITH PROPOFOL;  Surgeon: Eloise Harman, DO;  [facility];  Service: Endoscopy;  Laterality: N/A;  11:00 ASA I/II, pt tested + 1/26   COLONOSCOPY WITH PROPOFOL N/A 06/21/2020   Procedure: COLONOSCOPY WITH PROPOFOL;  Surgeon: Rush Landmark Telford Nab., MD;  Location: [facility];  Service: Gastroenterology;  Laterality: N/A;   ENDOSCOPIC MUCOSAL RESECTION N/A 06/21/2020   Procedure: ENDOSCOPIC MUCOSAL RESECTION;  Surgeon: Rush Landmark Telford Nab., MD;  Location: [facility];  Service: Gastroenterology;  Laterality: N/A;    HEMOSTASIS CLIP PLACEMENT  06/21/2020   Procedure: HEMOSTASIS CLIP PLACEMENT;  Surgeon: Irving Copas., MD;  Location: [facility];  Service: Gastroenterology;;   POLYPECTOMY  04/05/2020   Procedure: POLYPECTOMY;  Surgeon: Eloise Harman, DO;  [facility];  Service: Endoscopy;;   POLYPECTOMY  06/21/2020   Procedure: POLYPECTOMY;  Surgeon: Irving Copas., MD;  Location: [facility];  Service: Gastroenterology;;    Social History   Socioeconomic History   Marital status: Single    Spouse name: Not on file   Number of children: Not on file   Years of education: Not on file   Highest education level: Not on file  Occupational History   Occupation: [employer]  Tobacco Use   Smoking status: Every Day    Packs/day: 0.25    Years: 30.00    Total pack years: 7.50    Types: Cigarettes   Smokeless tobacco: Never  Vaping Use   Vaping Use: Never used  Substance and Sexual Activity   Alcohol use: Yes    Comment: occ   Drug use: No   Sexual activity: Not on file  Other Topics Concern   Not on file  Social History Narrative   Not on file   Social Determinants of Health   Financial Resource Strain: Not on file  Food Insecurity: Not on file  Transportation Needs: Not on file  Physical Activity: Not on file  Stress: Not on file  Social Connections: Not on file  Intimate Partner Violence: Not on file        Objective:    BP 113/78   Pulse 75   Temp 97.7 F (36.5 C) (Temporal)   Ht $R'5\' 2"'Ne$  (1.575 m)   Wt 191 lb (86.6 kg)   SpO2 97%   BMI 34.93 kg/m   Wt Readings from Last 3 Encounters:  08/11/21 191 lb (86.6 kg)  06/30/21 192 lb 12.8 oz (87.5 kg)  02/03/21 188 lb (85.3 kg)    Physical Exam Vitals reviewed.  Constitutional:      General: She is not in acute distress.    Appearance: Normal appearance. She is obese. She is not ill-appearing, toxic-appearing or diaphoretic.  HENT:     Head: Normocephalic and atraumatic.  Eyes:     General: No  scleral icterus.       Right eye: No discharge.        Left eye: No discharge.     Conjunctiva/sclera: Conjunctivae normal.  Cardiovascular:     Rate and Rhythm: Normal rate and regular rhythm.     Heart sounds: Normal heart sounds. No murmur heard.    No friction rub. No gallop.  Pulmonary:  Effort: Pulmonary effort is normal. No respiratory distress.     Breath sounds: Normal breath sounds. No stridor. No wheezing, rhonchi or rales.  Musculoskeletal:        General: Normal range of motion.     Cervical back: Normal range of motion.  Skin:    General: Skin is warm and dry.     Capillary Refill: Capillary refill takes less than 2 seconds.  Neurological:     General: No focal deficit present.     Mental Status: She is alert and oriented to person, place, and time. Mental status is at baseline.  Psychiatric:        Mood and Affect: Mood normal.        Behavior: Behavior normal.        Thought Content: Thought content normal.        Judgment: Judgment normal.     Lab Results  Component Value Date   TSH 1.580 11/13/2019   Lab Results  Component Value Date   WBC 6.4 06/30/2021   HGB 13.5 06/30/2021   HCT 40.7 06/30/2021   MCV 85 06/30/2021   PLT 292 06/30/2021   Lab Results  Component Value Date   NA 139 06/30/2021   K 4.3 06/30/2021   CO2 23 06/30/2021   GLUCOSE 91 06/30/2021   BUN 15 06/30/2021   CREATININE 0.61 06/30/2021   BILITOT 0.6 06/30/2021   ALKPHOS 93 06/30/2021   AST 18 06/30/2021   ALT 19 06/30/2021   PROT 6.6 06/30/2021   ALBUMIN 4.1 06/30/2021   CALCIUM 9.3 06/30/2021   EGFR 104 06/30/2021   Lab Results  Component Value Date   CHOL 232 (H) 06/30/2021   Lab Results  Component Value Date   HDL 46 06/30/2021   Lab Results  Component Value Date   LDLCALC 164 (H) 06/30/2021   Lab Results  Component Value Date   TRIG 120 06/30/2021   Lab Results  Component Value Date   CHOLHDL 5.0 (H) 06/30/2021   No results found for: "HGBA1C"

## 2021-10-19 ENCOUNTER — Ambulatory Visit: Payer: BC Managed Care – PPO | Admitting: Family Medicine

## 2021-10-19 ENCOUNTER — Encounter: Payer: Self-pay | Admitting: Family Medicine

## 2021-10-19 VITALS — BP 118/75 | HR 75 | Temp 98.1°F | Resp 22 | Ht 62.0 in | Wt 191.0 lb

## 2021-10-19 DIAGNOSIS — M170 Bilateral primary osteoarthritis of knee: Secondary | ICD-10-CM | POA: Diagnosis not present

## 2021-10-19 DIAGNOSIS — F419 Anxiety disorder, unspecified: Secondary | ICD-10-CM | POA: Diagnosis not present

## 2021-10-19 DIAGNOSIS — E669 Obesity, unspecified: Secondary | ICD-10-CM | POA: Diagnosis not present

## 2021-10-19 DIAGNOSIS — F32A Depression, unspecified: Secondary | ICD-10-CM | POA: Diagnosis not present

## 2021-10-19 DIAGNOSIS — E782 Mixed hyperlipidemia: Secondary | ICD-10-CM | POA: Diagnosis not present

## 2021-10-19 LAB — BAYER DCA HB A1C WAIVED: HB A1C (BAYER DCA - WAIVED): 5.1 % (ref 4.8–5.6)

## 2021-10-19 MED ORDER — ESCITALOPRAM OXALATE 10 MG PO TABS: 10.0000 mg | ORAL_TABLET | Freq: Every day | ORAL | 1 refills | Status: DC

## 2021-10-19 NOTE — Progress Notes (Signed)
Assessment & Plan:  1. Anxiety and depression Not well controlled, but patient does not desire any dose increase at this time. - escitalopram (LEXAPRO) 10 MG tablet; Take 1 tablet (10 mg total) by mouth daily.  Dispense: 90 tablet; Refill: 1 - CBC with Differential/Platelet  2. Obesity (BMI 30.0-34.9) Encouraged healthy eating and exercise. - CBC with Differential/Platelet - Bayer DCA Hb A1c Waived - CMP14+EGFR  3. Mixed hyperlipidemia Labs to reassess with the lifestyle modifications recommended at last visit. - CMP14+EGFR - Lipid panel  4. Bilateral primary osteoarthritis of knee Lab work to be faxed as requested. - CBC with Differential/Platelet - Bayer DCA Hb A1c Waived - CMP14+EGFR   Return in about 6 months (around 04/19/2022) for follow-up of chronic medication conditions.  Hendricks Limes, MSN, APRN, FNP-C [facility]  Subjective:    Patient ID: Tamara Mendoza, female    DOB: [birth year], 56 y.o.   MRN: [id number]  Patient Care Team: Loman Brooklyn, FNP as PCP - General (Family Medicine) Eloise Harman, DO as Consulting Physician (Internal Medicine) Lavera Guise, [facility] as Pharmacist (Family Medicine)   Chief Complaint:  Chief Complaint  Patient presents with   Medical Management of Chronic Issues    3-4 mo - also need labs for surgeon (knee)    HPI: Tamara Mendoza is a 56 y.o. female presenting on 10/19/2021 for Medical Management of Chronic Issues (3-4 mo - also need labs for surgeon (knee))  Anxiety/Depression: has been taking Lexapro, but ran out a week or so ago. She reports today she does feel she has had some improvement but feels her remaining symptoms are due to the frustration of pain due to needing a knee replacement. She does not desire a dose change at this time.     10/19/2021   10:44 AM 08/11/2021    9:49 AM 06/30/2021    9:00 AM  Depression screen PHQ 2/9  Decreased Interest 3 2 3   Down, Depressed, Hopeless 3 1 1   PHQ -  2 Score 6 3 4   Altered sleeping 3 3 3   Tired, decreased energy 3 3 3   Change in appetite 3 2 2   Feeling bad or failure about yourself  1 0 1  Trouble concentrating 1 1 1   Moving slowly or fidgety/restless 1 0 1  Suicidal thoughts 0 0 0  PHQ-9 Score 18 12 15   Difficult doing work/chores Somewhat difficult Somewhat difficult Somewhat difficult      10/19/2021   10:45 AM 08/11/2021    9:49 AM 06/30/2021    9:02 AM 01/27/2021    2:26 PM  GAD 7 : Generalized Anxiety Score  Nervous, Anxious, on Edge 3 1 2  0  Control/stop worrying 2 1 2 2   Worry too much - different things 2 1 2 2   Trouble relaxing 2 2 3 2   Restless 2 1 1  0  Easily annoyed or irritable 2 2 3 2   Afraid - awful might happen 1 1 1  0  Total GAD 7 Score 14 9 14 8   Anxiety Difficulty Somewhat difficult Somewhat difficult Somewhat difficult Not difficult at all   New complaints: She is in need of lab work for her upcoming knee surgery. This will be completed at [facility]. Results are to be faxed to Miguel Rota at [phone].   Social history:  Relevant past medical, surgical, family and social history reviewed and updated as indicated. Interim medical history since our last visit  reviewed.  Allergies and medications reviewed and updated.  DATA REVIEWED: CHART IN EPIC  ROS: Negative unless specifically indicated above in HPI.    Current Outpatient Medications:    escitalopram (LEXAPRO) 10 MG tablet, Take 1 tablet (10 mg total) by mouth daily., Disp: 90 tablet, Rfl: 1   meloxicam (MOBIC) 15 MG tablet, Take 1 tablet (15 mg total) by mouth daily. (Patient not taking: Reported on 10/19/2021), Disp: 90 tablet, Rfl: 3   No Known Allergies Past Medical History:  Diagnosis Date   Anxiety and depression    Arthritis    Eczema    Mixed hyperlipidemia     Past Surgical History:  Procedure Laterality Date   BIOPSY  04/05/2020   Procedure: BIOPSY;  Surgeon: Eloise Harman, DO;  [facility];  Service: Endoscopy;;   BIOPSY  06/21/2020   Procedure: BIOPSY;  Surgeon: Irving Copas., MD;  Location: [facility];  Service: Gastroenterology;;   CESAREAN SECTION     COLONOSCOPY WITH PROPOFOL N/A 04/05/2020   Procedure: COLONOSCOPY WITH PROPOFOL;  Surgeon: Eloise Harman, DO;  [facility];  Service: Endoscopy;  Laterality: N/A;  11:00 ASA I/II, pt tested + 1/26   COLONOSCOPY WITH PROPOFOL N/A 06/21/2020   Procedure: COLONOSCOPY WITH PROPOFOL;  Surgeon: Rush Landmark Telford Nab., MD;  Location: [facility];  Service: Gastroenterology;  Laterality: N/A;   ENDOSCOPIC MUCOSAL RESECTION N/A 06/21/2020   Procedure: ENDOSCOPIC MUCOSAL RESECTION;  Surgeon: Rush Landmark Telford Nab., MD;  Location: [facility];  Service: Gastroenterology;  Laterality: N/A;   HEMOSTASIS CLIP PLACEMENT  06/21/2020   Procedure: HEMOSTASIS CLIP PLACEMENT;  Surgeon: Irving Copas., MD;  Location: [facility];  Service: Gastroenterology;;   POLYPECTOMY  04/05/2020   Procedure: POLYPECTOMY;  Surgeon: Eloise Harman, DO;  [facility];  Service: Endoscopy;;   POLYPECTOMY  06/21/2020   Procedure: POLYPECTOMY;  Surgeon: Irving Copas., MD;  Location: [facility];  Service: Gastroenterology;;    Social History   Socioeconomic History   Marital status: Single    Spouse name: Not on file   Number of children: Not on file   Years of education: Not on file   Highest education level: Not on file  Occupational History   Occupation: [employer]  Tobacco Use   Smoking status: Every Day    Packs/day: 0.25    Years: 30.00    Total pack years: 7.50    Types: Cigarettes   Smokeless tobacco: Never  Vaping Use   Vaping Use: Never used  Substance and Sexual Activity   Alcohol use: Yes    Comment: occ   Drug use: No   Sexual activity: Not on file  Other Topics Concern   Not on file  Social History Narrative   Not on file   Social Determinants of Health   Financial  Resource Strain: Not on file  Food Insecurity: Not on file  Transportation Needs: Not on file  Physical Activity: Not on file  Stress: Not on file  Social Connections: Not on file  Intimate Partner Violence: Not on file        Objective:    BP 118/75   Pulse 75   Temp 98.1 F (36.7 C)   Resp (!) 22   Ht 5' 2"  (1.575 m)   Wt 191 lb (86.6 kg)   SpO2 95%   BMI 34.93 kg/m   Wt Readings from Last 3 Encounters:  10/19/21 191 lb (86.6 kg)  08/11/21 191  lb (86.6 kg)  06/30/21 192 lb 12.8 oz (87.5 kg)    Physical Exam Vitals reviewed.  Constitutional:      General: She is not in acute distress.    Appearance: Normal appearance. She is obese. She is not ill-appearing, toxic-appearing or diaphoretic.  HENT:     Head: Normocephalic and atraumatic.  Eyes:     General: No scleral icterus.       Right eye: No discharge.        Left eye: No discharge.     Conjunctiva/sclera: Conjunctivae normal.  Cardiovascular:     Rate and Rhythm: Normal rate and regular rhythm.     Heart sounds: Normal heart sounds. No murmur heard.    No friction rub. No gallop.  Pulmonary:     Effort: Pulmonary effort is normal. No respiratory distress.     Breath sounds: Normal breath sounds. No stridor. No wheezing, rhonchi or rales.  Musculoskeletal:        General: Normal range of motion.     Cervical back: Normal range of motion.  Skin:    General: Skin is warm and dry.     Capillary Refill: Capillary refill takes less than 2 seconds.  Neurological:     General: No focal deficit present.     Mental Status: She is alert and oriented to person, place, and time. Mental status is at baseline.  Psychiatric:        Mood and Affect: Mood normal.        Behavior: Behavior normal.        Thought Content: Thought content normal.        Judgment: Judgment normal.     Lab Results  Component Value Date   TSH 1.580 11/13/2019   Lab Results  Component Value Date   WBC 6.4 06/30/2021   HGB 13.5  06/30/2021   HCT 40.7 06/30/2021   MCV 85 06/30/2021   PLT 292 06/30/2021   Lab Results  Component Value Date   NA 139 06/30/2021   K 4.3 06/30/2021   CO2 23 06/30/2021   GLUCOSE 91 06/30/2021   BUN 15 06/30/2021   CREATININE 0.61 06/30/2021   BILITOT 0.6 06/30/2021   ALKPHOS 93 06/30/2021   AST 18 06/30/2021   ALT 19 06/30/2021   PROT 6.6 06/30/2021   ALBUMIN 4.1 06/30/2021   CALCIUM 9.3 06/30/2021   EGFR 104 06/30/2021   Lab Results  Component Value Date   CHOL 232 (H) 06/30/2021   Lab Results  Component Value Date   HDL 46 06/30/2021   Lab Results  Component Value Date   LDLCALC 164 (H) 06/30/2021   Lab Results  Component Value Date   TRIG 120 06/30/2021   Lab Results  Component Value Date   CHOLHDL 5.0 (H) 06/30/2021   No results found for: "HGBA1C"

## 2021-10-20 LAB — CMP14+EGFR
ALT: 12 IU/L (ref 0–32)
AST: 15 IU/L (ref 0–40)
Albumin/Globulin Ratio: 1.8 (ref 1.2–2.2)
Albumin: 4.3 g/dL (ref 3.8–4.9)
Alkaline Phosphatase: 81 IU/L (ref 44–121)
BUN/Creatinine Ratio: 21 (ref 9–23)
BUN: 11 mg/dL (ref 6–24)
Bilirubin Total: 0.5 mg/dL (ref 0.0–1.2)
CO2: 23 mmol/L (ref 20–29)
Calcium: 9.4 mg/dL (ref 8.7–10.2)
Chloride: 102 mmol/L (ref 96–106)
Creatinine, Ser: 0.52 mg/dL — ABNORMAL LOW (ref 0.57–1.00)
Globulin, Total: 2.4 g/dL (ref 1.5–4.5)
Glucose: 81 mg/dL (ref 70–99)
Potassium: 4.2 mmol/L (ref 3.5–5.2)
Sodium: 139 mmol/L (ref 134–144)
Total Protein: 6.7 g/dL (ref 6.0–8.5)
eGFR: 108 mL/min/{1.73_m2} (ref >59)

## 2021-10-20 LAB — CBC WITH DIFFERENTIAL/PLATELET
Basophils Absolute: 0.1 10*3/uL (ref 0.0–0.2)
Basos: 1 %
EOS (ABSOLUTE): 0.1 10*3/uL (ref 0.0–0.4)
Eos: 1 %
Hematocrit: 43.6 % (ref 34.0–46.6)
Hemoglobin: 14.1 g/dL (ref 11.1–15.9)
Immature Grans (Abs): 0 10*3/uL (ref 0.0–0.1)
Immature Granulocytes: 0 %
Lymphocytes Absolute: 1.6 10*3/uL (ref 0.7–3.1)
Lymphs: 31 %
MCH: 27.6 pg (ref 26.6–33.0)
MCHC: 32.3 g/dL (ref 31.5–35.7)
MCV: 85 fL (ref 79–97)
Monocytes Absolute: 0.5 10*3/uL (ref 0.1–0.9)
Monocytes: 10 %
Neutrophils Absolute: 3 10*3/uL (ref 1.4–7.0)
Neutrophils: 57 %
Platelets: 281 10*3/uL (ref 150–450)
RBC: 5.11 x10E6/uL (ref 3.77–5.28)
RDW: 12.4 % (ref 11.7–15.4)
WBC: 5.3 10*3/uL (ref 3.4–10.8)

## 2021-10-20 LAB — LIPID PANEL
Chol/HDL Ratio: 5.2 ratio — ABNORMAL HIGH (ref 0.0–4.4)
Cholesterol, Total: 225 mg/dL — ABNORMAL HIGH (ref 100–199)
HDL: 43 mg/dL (ref >39)
LDL Chol Calc (NIH): 152 mg/dL — ABNORMAL HIGH (ref 0–99)
Triglycerides: 166 mg/dL — ABNORMAL HIGH (ref 0–149)
VLDL Cholesterol Cal: 30 mg/dL (ref 5–40)

## 2021-10-27 ENCOUNTER — Ambulatory Visit: Payer: BC Managed Care – PPO | Admitting: Family Medicine

## 2021-10-27 ENCOUNTER — Encounter: Payer: Self-pay | Admitting: Family Medicine

## 2021-10-27 VITALS — BP 105/71 | HR 71 | Temp 96.9°F | Resp 20 | Ht 62.0 in | Wt 191.0 lb

## 2021-10-27 DIAGNOSIS — Z01818 Encounter for other preprocedural examination: Secondary | ICD-10-CM | POA: Diagnosis not present

## 2021-10-27 NOTE — Progress Notes (Signed)
Pt is a 56 y.o. female who is here for preoperative clearance for left knee replacement.  1) High Risk Cardiac Conditions  1) Recent MI - No.  2) Decompensated Heart Failure - No.  3) Unstable angina - No.  4) Symptomatic arrythmia - No.  5) Sx Valvular Disease - No.  2) Intermediate Risk Factors - DM, CKD, CVA, CHF, CAD - No.  2) Functional Status - > 4 mets (Walk, run, climb stairs) Yes.  Rob Hickman Activity Status Index: 7.44  3) Surgery Specific Risk - Intermediate   4) Further Noninvasive evaluation -   1) EKG - No.   1) Hx of CVA, CAD, DM, CKD  2) Echo - No.   1) Worsening dyspnea   3) Stress Testing - Active Cardiac Disease - No.  5) Need for medical therapy - Beta Blocker, Statins indicated ? No.  PE: Vitals:   10/27/21 1442  BP: 105/71  Pulse: 71  Resp: 20  Temp: (!) 96.9 F (36.1 C)  SpO2: 96%    Physical Exam Vitals reviewed.  Constitutional:      General: She is not in acute distress.    Appearance: Normal appearance. She is obese. She is not ill-appearing, toxic-appearing or diaphoretic.  HENT:     Head: Normocephalic and atraumatic.     Right Ear: Tympanic membrane, ear canal and external ear normal. There is no impacted cerumen.     Left Ear: Tympanic membrane, ear canal and external ear normal. There is no impacted cerumen.     Nose: Nose normal. No congestion or rhinorrhea.     Mouth/Throat:     Mouth: Mucous membranes are moist.     Pharynx: Oropharynx is clear. No oropharyngeal exudate or posterior oropharyngeal erythema.  Eyes:     General: No scleral icterus.       Right eye: No discharge.        Left eye: No discharge.     Conjunctiva/sclera: Conjunctivae normal.     Pupils: Pupils are equal, round, and reactive to light.  Cardiovascular:     Rate and Rhythm: Normal rate and regular rhythm.     Heart sounds: Normal heart sounds. No murmur heard.    No friction rub. No gallop.  Pulmonary:     Effort: Pulmonary effort is normal. No  respiratory distress.     Breath sounds: Normal breath sounds. No stridor. No wheezing, rhonchi or rales.  Abdominal:     General: Abdomen is flat. Bowel sounds are normal. There is no distension.     Palpations: Abdomen is soft. There is no hepatomegaly, splenomegaly or mass.     Tenderness: There is no abdominal tenderness. There is no guarding or rebound.     Hernia: No hernia is present.  Musculoskeletal:        General: Normal range of motion.     Cervical back: Normal range of motion and neck supple. No rigidity. No muscular tenderness.  Lymphadenopathy:     Cervical: No cervical adenopathy.  Skin:    General: Skin is warm and dry.     Capillary Refill: Capillary refill takes less than 2 seconds.  Neurological:     General: No focal deficit present.     Mental Status: She is alert and oriented to person, place, and time. Mental status is at baseline.  Psychiatric:        Mood and Affect: Mood normal.        Behavior: Behavior normal.  Thought Content: Thought content normal.        Judgment: Judgment normal.   1. Preoperative clearance I have independently evaluated patient.  Tamara Mendoza is a 56 y.o. female who is low risk for a intermediate risk surgery.  There are not modifiable risk factors. [name]'s RCRI/NSQIP calculation for MACE is: 3.9%.    EKG: sinus rhythm; rate 74  ***send letter  Hendricks Limes, MSN, APRN, FNP-C [facility]

## 2021-11-24 ENCOUNTER — Other Ambulatory Visit: Payer: Self-pay

## 2021-11-24 ENCOUNTER — Ambulatory Visit: Payer: BC Managed Care – PPO | Attending: Orthopaedic Surgery

## 2021-11-24 DIAGNOSIS — M25562 Pain in left knee: Secondary | ICD-10-CM | POA: Diagnosis not present

## 2021-11-24 DIAGNOSIS — M25662 Stiffness of left knee, not elsewhere classified: Secondary | ICD-10-CM | POA: Insufficient documentation

## 2021-11-24 NOTE — Therapy (Signed)
OUTPATIENT PHYSICAL THERAPY LOWER EXTREMITY EVALUATION   Patient Name: DEVANI ODONNEL MRN: [id number] DOB:1966-01-23, 56 y.o., female Today's Date: 11/24/2021   PT End of Session - 11/24/21 0955     Visit Number 1    Number of Visits 18    Date for PT Re-Evaluation 01/06/22    PT Start Time 0956    PT Stop Time 1040    PT Time Calculation (min) 44 min    Activity Tolerance Patient tolerated treatment well    Behavior During Therapy [facility] for tasks assessed/performed             Past Medical History:  Diagnosis Date   Anxiety and depression    Arthritis    Eczema    Mixed hyperlipidemia    Past Surgical History:  Procedure Laterality Date   BIOPSY  04/05/2020   Procedure: BIOPSY;  Surgeon: Eloise Harman, DO;  [facility];  Service: Endoscopy;;   BIOPSY  06/21/2020   Procedure: BIOPSY;  Surgeon: Irving Copas., MD;  Location: [facility];  Service: Gastroenterology;;   CESAREAN SECTION     COLONOSCOPY WITH PROPOFOL N/A 04/05/2020   Procedure: COLONOSCOPY WITH PROPOFOL;  Surgeon: Eloise Harman, DO;  [facility];  Service: Endoscopy;  Laterality: N/A;  11:00 ASA I/II, pt tested + 1/26   COLONOSCOPY WITH PROPOFOL N/A 06/21/2020   Procedure: COLONOSCOPY WITH PROPOFOL;  Surgeon: Rush Landmark Telford Nab., MD;  Location: [facility];  Service: Gastroenterology;  Laterality: N/A;   ENDOSCOPIC MUCOSAL RESECTION N/A 06/21/2020   Procedure: ENDOSCOPIC MUCOSAL RESECTION;  Surgeon: Rush Landmark Telford Nab., MD;  Location: [facility];  Service: Gastroenterology;  Laterality: N/A;   HEMOSTASIS CLIP PLACEMENT  06/21/2020   Procedure: HEMOSTASIS CLIP PLACEMENT;  Surgeon: Irving Copas., MD;  Location: [facility];  Service: Gastroenterology;;   POLYPECTOMY  04/05/2020   Procedure: POLYPECTOMY;  Surgeon: Eloise Harman, DO;  [facility];  Service: Endoscopy;;   POLYPECTOMY  06/21/2020   Procedure: POLYPECTOMY;  Surgeon: Irving Copas., MD;  Location: [facility];  Service: Gastroenterology;;   Patient Active Problem List   Diagnosis Date Noted   Bilateral primary osteoarthritis of knee 11/04/2020   Obesity (BMI 30.0-34.9) 09/09/2020   Arthritis 04/01/2020   Mixed hyperlipidemia    Difficulty sleeping 11/16/2019   Hot flashes 11/16/2019   Eczema of both hands 11/16/2019   Tobacco use 11/13/2019   Anxiety and depression 10/02/2019   REFERRING PROVIDER: Tillman Abide, PA-C  REFERRING DIAG: left knee replacement  THERAPY DIAG:  Stiffness of left knee, not elsewhere classified  Acute pain of left knee  Rationale for Evaluation and Treatment Rehabilitation  ONSET DATE: 11/16/21  SUBJECTIVE:   SUBJECTIVE STATEMENT: Patient reports that she had a left knee replacement on 11/16/21. She had 5 visits with physical therapy prior to coming home on 11/22/21. She notes that she has been doing some exercising at home. She was told that she would be out of work for about 3 months.   PERTINENT HISTORY: Will need R knee replacement, OA, anxiety, and depression  PAIN:  Are you having pain? Yes: NPRS scale: 5/10 Pain location: left knee Pain description: sore and aching Aggravating factors: doing a lot of activities Relieving factors: medication, ice  PRECAUTIONS: None  WEIGHT BEARING RESTRICTIONS No  FALLS:  Has patient fallen in last 6 months? No  LIVING ENVIRONMENT: Lives with: lives with their family Lives in: House/apartment Stairs:  yes, but she does  not have to use them Has following equipment at home: Single point cane  OCCUPATION: full time; be able to lift and carry up to 10-15 pounds, climb a ladder  PLOF: Independent  PATIENT GOALS reduced pain, walk normally, and return to her normal work and daily activities   OBJECTIVE:  PATIENT SURVEYS:  FOTO 33.87  COGNITION:  Overall cognitive status: Within functional limits for tasks assessed     SENSATION: Patient reports no  numbness or tingling  EDEMA:   Moderate left knee edema   PALPATION: TTP: left joint line  JOINT MOBILITY:  Left patella: hypomobile  LOWER EXTREMITY ROM:  Active ROM Right eval Left eval  Hip flexion    Hip extension    Hip abduction    Hip adduction    Hip internal rotation    Hip external rotation    Knee flexion 130; painful  85/87 (PROM)  Knee extension 0 9  Ankle dorsiflexion    Ankle plantarflexion    Ankle inversion    Ankle eversion     (Blank rows = not tested)  GAIT: Assistive device utilized: Single point cane Level of assistance: Modified independence Comments: decreased gait speed, minimal left knee flexion in swing phase, left knee flexed in stance phase, and decreased stride length  TODAY'S TREATMENT:                                   10/12 EXERCISE LOG  Exercise Repetitions and Resistance Comments  Quad sets 20 reps w/ 5 second hold   LAQ 20 reps   Seated heel/toe raises 20 reps            Blank cell = exercise not performed today  Modalities  Date:  Vaso: Knee, 34 degrees; low pressure, 5 mins, Pain and Edema   PATIENT EDUCATION:  Education details: POC, healing, prognosis, HEP Person educated: Patient Education method: Explanation Education comprehension: verbalized understanding   HOME EXERCISE PROGRAM: She was educated to perform today's interventions twice a day for improved knee mobility  ASSESSMENT:  CLINICAL IMPRESSION: Patient is a 57 y.o. female who was seen today for physical therapy evaluation and treatment following a left TKA on 11/16/21. She presented with moderate pain severity and irritability with left knee range of motion being the most aggravating to her familiar symptoms. She was provided an updated HEP which included today's interventions which she was able to properly perform. She reported feeling comfortable with these interventions. Recommend that she continue with skilled physical therapy to address her  remaining impairments to return to her prior level of function.    OBJECTIVE IMPAIRMENTS Abnormal gait, decreased activity tolerance, decreased mobility, difficulty walking, decreased ROM, decreased strength, hypomobility, increased edema, impaired flexibility, and pain.   ACTIVITY LIMITATIONS carrying, lifting, bending, standing, squatting, stairs, transfers, bathing, dressing, and locomotion level  PARTICIPATION LIMITATIONS: meal prep, cleaning, laundry, driving, shopping, community activity, and occupation  PERSONAL FACTORS 3+ comorbidities: current smoker, OA, anxiety, and depression  are also affecting patient's functional outcome.   REHAB POTENTIAL: Good  CLINICAL DECISION MAKING: Stable/uncomplicated  EVALUATION COMPLEXITY: Low   GOALS: Goals reviewed with patient? No  SHORT TERM GOALS: Target date: 12/15/2021  Patient will be independent with her initial HEP.  Baseline: Goal status: INITIAL  2.  Patient will be able to demonstrate at least 95 degrees of active left knee flexion for improved mobility.  Baseline:  Goal status: INITIAL  3.  Patient will be able to demonstrate active left knee extension within 5 degrees of neutral for improved gait mechanics.  Baseline:  Goal status: INITIAL  4.  Patient will be able to complete her daily activities without her familiar pain exceeding 3/10.  Baseline:  Goal status: INITIAL  LONG TERM GOALS: Target date: 01/05/2022   Patient will be independent with her advanced HEP.  Baseline:  Goal status: INITIAL  2.  Patient will be able to demonstrate at least 120 degrees of active left knee flexion for improved mobility.  Baseline:  Goal status: INITIAL  3.  Patient will be able to safely ambulate at least 80 feet without an assistive device for improved functional mobility.  Baseline:  Goal status: INITIAL  4.  Patient will be able to ambulate with no significant gait deviations.  Baseline:  Goal status:  INITIAL  PLAN: PT FREQUENCY: 3x/week  PT DURATION: 6 weeks  PLANNED INTERVENTIONS: Therapeutic exercises, Therapeutic activity, Neuromuscular re-education, Balance training, Gait training, Patient/Family education, Self Care, Joint mobilization, Stair training, Electrical stimulation, Cryotherapy, Moist heat, Vasopneumatic device, Manual therapy, and Re-evaluation  PLAN FOR NEXT SESSION: nustep, lower extremity strengthening, knee AROM, manual therapy, and modalities as needed   Darlin Coco, PT 11/24/2021, 6:28 PM

## 2021-11-28 ENCOUNTER — Ambulatory Visit: Payer: BC Managed Care – PPO

## 2021-11-28 DIAGNOSIS — M25562 Pain in left knee: Secondary | ICD-10-CM | POA: Diagnosis not present

## 2021-11-28 DIAGNOSIS — M25662 Stiffness of left knee, not elsewhere classified: Secondary | ICD-10-CM | POA: Diagnosis not present

## 2021-11-28 NOTE — Therapy (Signed)
OUTPATIENT PHYSICAL THERAPY LOWER EXTREMITY TREATMENT   Patient Name: Tamara Mendoza MRN: [id number] DOB:1966/01/19, 56 y.o., female Today's Date: 11/28/2021   PT End of Session - 11/28/21 1124     Visit Number 2    Number of Visits 18    Date for PT Re-Evaluation 01/06/22    PT Start Time 1030    PT Stop Time 1194    PT Time Calculation (min) 58 min    Activity Tolerance Patient tolerated treatment well    Behavior During Therapy [facility] for tasks assessed/performed              Past Medical History:  Diagnosis Date   Anxiety and depression    Arthritis    Eczema    Mixed hyperlipidemia    Past Surgical History:  Procedure Laterality Date   BIOPSY  04/05/2020   Procedure: BIOPSY;  Surgeon: Eloise Harman, DO;  [facility];  Service: Endoscopy;;   BIOPSY  06/21/2020   Procedure: BIOPSY;  Surgeon: Irving Copas., MD;  Location: [facility];  Service: Gastroenterology;;   CESAREAN SECTION     COLONOSCOPY WITH PROPOFOL N/A 04/05/2020   Procedure: COLONOSCOPY WITH PROPOFOL;  Surgeon: Eloise Harman, DO;  [facility];  Service: Endoscopy;  Laterality: N/A;  11:00 ASA I/II, pt tested + 1/26   COLONOSCOPY WITH PROPOFOL N/A 06/21/2020   Procedure: COLONOSCOPY WITH PROPOFOL;  Surgeon: Rush Landmark Telford Nab., MD;  Location: [facility];  Service: Gastroenterology;  Laterality: N/A;   ENDOSCOPIC MUCOSAL RESECTION N/A 06/21/2020   Procedure: ENDOSCOPIC MUCOSAL RESECTION;  Surgeon: Rush Landmark Telford Nab., MD;  Location: [facility];  Service: Gastroenterology;  Laterality: N/A;   HEMOSTASIS CLIP PLACEMENT  06/21/2020   Procedure: HEMOSTASIS CLIP PLACEMENT;  Surgeon: Irving Copas., MD;  Location: [facility];  Service: Gastroenterology;;   POLYPECTOMY  04/05/2020   Procedure: POLYPECTOMY;  Surgeon: Eloise Harman, DO;  [facility];  Service: Endoscopy;;   POLYPECTOMY  06/21/2020   Procedure: POLYPECTOMY;  Surgeon: Irving Copas., MD;  Location: [facility];  Service: Gastroenterology;;   Patient Active Problem List   Diagnosis Date Noted   Bilateral primary osteoarthritis of knee 11/04/2020   Obesity (BMI 30.0-34.9) 09/09/2020   Arthritis 04/01/2020   Mixed hyperlipidemia    Difficulty sleeping 11/16/2019   Hot flashes 11/16/2019   Eczema of both hands 11/16/2019   Tobacco use 11/13/2019   Anxiety and depression 10/02/2019   REFERRING PROVIDER: Tillman Abide, PA-C  REFERRING DIAG: left knee replacement  THERAPY DIAG:  Stiffness of left knee, not elsewhere classified  Acute pain of left knee  Rationale for Evaluation and Treatment Rehabilitation  ONSET DATE: 11/16/21  SUBJECTIVE:   SUBJECTIVE STATEMENT: Patient reports that her knee is bothering her little more today. She notes that she has been pushing herself and she got behind on her pain medication.   PERTINENT HISTORY: Will need R knee replacement, OA, anxiety, and depression  PAIN:  Are you having pain? Yes: NPRS scale: 5/10 Pain location: left knee Pain description: sore and aching Aggravating factors: doing a lot of activities Relieving factors: medication, ice  PRECAUTIONS: None  WEIGHT BEARING RESTRICTIONS No  FALLS:  Has patient fallen in last 6 months? No  LIVING ENVIRONMENT: Lives with: lives with their family Lives in: House/apartment Stairs:  yes, but she does not have to use them Has following equipment at home: Single point cane  OCCUPATION: full time; be able to lift  and carry up to 10-15 pounds, climb a ladder  PLOF: Independent  PATIENT GOALS reduced pain, walk normally, and return to her normal work and daily activities   OBJECTIVE:  PATIENT SURVEYS:  FOTO 33.87  COGNITION:  Overall cognitive status: Within functional limits for tasks assessed     SENSATION: Patient reports no numbness or tingling  EDEMA:   Moderate left knee edema   PALPATION: TTP: left joint line  JOINT  MOBILITY:  Left patella: hypomobile  LOWER EXTREMITY ROM:  Active ROM Right eval Left eval  Hip flexion    Hip extension    Hip abduction    Hip adduction    Hip internal rotation    Hip external rotation    Knee flexion 130; painful  85/87 (PROM)  Knee extension 0 9  Ankle dorsiflexion    Ankle plantarflexion    Ankle inversion    Ankle eversion     (Blank rows = not tested)  GAIT: Assistive device utilized: Single point cane Level of assistance: Modified independence Comments: decreased gait speed, minimal left knee flexion in swing phase, left knee flexed in stance phase, and decreased stride length  TODAY'S TREATMENT:                                   10/16 EXERCISE LOG  Exercise Repetitions and Resistance Comments  Nustep L4 x 15 minutes; seat 6-5   LAQ  2 minutes   Seated hamstring stretch 4 x 30 seconds   Gastroc stretch  3 x 30 seconds   Lunges onto step  14" step x 2 minutes    Marching on foam 2 minutes    Rocker board 4 minutes    Blank cell = exercise not performed today  Modalities  Date:  Vaso: Knee, 34 degrees; low pressure, 15 mins, Pain and Edema                                   10/12 EXERCISE LOG  Exercise Repetitions and Resistance Comments  Quad sets 20 reps w/ 5 second hold   LAQ 20 reps   Seated heel/toe raises 20 reps            Blank cell = exercise not performed today  Modalities  Date:  Vaso: Knee, 34 degrees; low pressure, 5 mins, Pain and Edema   PATIENT EDUCATION:  Education details: POC, healing, prognosis, HEP Person educated: Patient Education method: Explanation Education comprehension: verbalized understanding   HOME EXERCISE PROGRAM: She was educated to perform today's interventions twice a day for improved knee mobility  ASSESSMENT:  CLINICAL IMPRESSION: Patient was introduced to multiple new interventions for improved knee mobility. She required minimal cueing with lunges onto a step for improved knee  flexion. She reported no significant increase in left knee pain or discomfort with any of today's interventions. She reported feeling good upon the conclusion of treatment. She continues to require skilled physical therapy to address her remaining impairments to return to her prior level of function.    OBJECTIVE IMPAIRMENTS Abnormal gait, decreased activity tolerance, decreased mobility, difficulty walking, decreased ROM, decreased strength, hypomobility, increased edema, impaired flexibility, and pain.   ACTIVITY LIMITATIONS carrying, lifting, bending, standing, squatting, stairs, transfers, bathing, dressing, and locomotion level  PARTICIPATION LIMITATIONS: meal prep, cleaning, laundry, driving, shopping, community activity, and occupation  [place]  3+ comorbidities: current smoker, OA, anxiety, and depression  are also affecting patient's functional outcome.   REHAB POTENTIAL: Good  CLINICAL DECISION MAKING: Stable/uncomplicated  EVALUATION COMPLEXITY: Low   GOALS: Goals reviewed with patient? No  SHORT TERM GOALS: Target date: 12/15/2021  Patient will be independent with her initial HEP.  Baseline: Goal status: INITIAL  2.  Patient will be able to demonstrate at least 95 degrees of active left knee flexion for improved mobility.  Baseline:  Goal status: INITIAL  3.  Patient will be able to demonstrate active left knee extension within 5 degrees of neutral for improved gait mechanics.  Baseline:  Goal status: INITIAL  4.  Patient will be able to complete her daily activities without her familiar pain exceeding 3/10.  Baseline:  Goal status: INITIAL  LONG TERM GOALS: Target date: 01/05/2022   Patient will be independent with her advanced HEP.  Baseline:  Goal status: INITIAL  2.  Patient will be able to demonstrate at least 120 degrees of active left knee flexion for improved mobility.  Baseline:  Goal status: INITIAL  3.  Patient will be able to safely  ambulate at least 80 feet without an assistive device for improved functional mobility.  Baseline:  Goal status: INITIAL  4.  Patient will be able to ambulate with no significant gait deviations.  Baseline:  Goal status: INITIAL  PLAN: PT FREQUENCY: 3x/week  PT DURATION: 6 weeks  PLANNED INTERVENTIONS: Therapeutic exercises, Therapeutic activity, Neuromuscular re-education, Balance training, Gait training, Patient/Family education, Self Care, Joint mobilization, Stair training, Electrical stimulation, Cryotherapy, Moist heat, Vasopneumatic device, Manual therapy, and Re-evaluation  PLAN FOR NEXT SESSION: nustep, lower extremity strengthening, knee AROM, manual therapy, and modalities as needed   Darlin Coco, PT 11/28/2021, 12:23 PM

## 2021-11-30 ENCOUNTER — Ambulatory Visit: Payer: BC Managed Care – PPO | Admitting: Nurse Practitioner

## 2021-11-30 ENCOUNTER — Encounter: Payer: Self-pay | Admitting: Nurse Practitioner

## 2021-11-30 ENCOUNTER — Encounter: Payer: Self-pay | Admitting: Physical Therapy

## 2021-11-30 ENCOUNTER — Ambulatory Visit: Payer: BC Managed Care – PPO | Admitting: Physical Therapy

## 2021-11-30 VITALS — BP 108/73 | HR 80 | Temp 98.6°F | Ht 62.0 in | Wt 196.0 lb

## 2021-11-30 DIAGNOSIS — M25562 Pain in left knee: Secondary | ICD-10-CM

## 2021-11-30 DIAGNOSIS — M25662 Stiffness of left knee, not elsewhere classified: Secondary | ICD-10-CM

## 2021-11-30 NOTE — Progress Notes (Signed)
Acute Office Visit  Subjective:     Patient ID: Tamara Mendoza, female    DOB: November 07, 1965, 56 y.o.   MRN: [id number]  Chief Complaint  Patient presents with   Follow-up    11/16/2021 - states she is doing well about a 5 in pain today     Knee Pain  The incident occurred more than 1 week ago. The incident occurred at work. The injury mechanism is unknown. The pain is present in the left knee. The quality of the pain is described as aching. The pain is at a severity of 5/10. The pain is moderate. The pain has been Intermittent since onset. Pertinent negatives include no loss of motion, loss of sensation, muscle weakness, numbness or tingling. She reports no foreign bodies present. The symptoms are aggravated by movement. Treatments tried: recent knee surgery. The treatment provided significant relief.    Review of Systems  Constitutional: Negative.  Negative for chills and fever.  HENT: Negative.    Eyes: Negative.   Respiratory: Negative.    Cardiovascular: Negative.   Genitourinary: Negative.   Musculoskeletal:  Positive for joint pain.  Skin: Negative.  Negative for rash.  Neurological:  Negative for tingling and numbness.  All other systems reviewed and are negative.       Objective:    BP 108/73   Pulse 80   Temp 98.6 F (37 C)   Ht '5\' 2"'$  (1.575 m)   Wt 196 lb (88.9 kg)   SpO2 99%   BMI 35.85 kg/m    Physical Exam Vitals and nursing note reviewed.  Constitutional:      Appearance: Normal appearance.  HENT:     Head: Normocephalic.     Right Ear: External ear normal.     Left Ear: External ear normal.     Nose: Nose normal.     Mouth/Throat:     Mouth: Mucous membranes are moist.     Pharynx: Oropharynx is clear.  Eyes:     Conjunctiva/sclera: Conjunctivae normal.  Cardiovascular:     Rate and Rhythm: Normal rate and regular rhythm.     Pulses: Normal pulses.     Heart sounds: Normal heart sounds.  Pulmonary:     Effort: Pulmonary effort is normal.      Breath sounds: Normal breath sounds.  Abdominal:     General: Bowel sounds are normal.  Musculoskeletal:       Legs:     Comments: Recent Knee surgery, moderate pain and tenderness  Neurological:     General: No focal deficit present.     Mental Status: She is alert and oriented to person, place, and time.    BP Readings from Last 3 Encounters:  11/30/21 108/73  10/27/21 105/71  10/19/21 118/75     No results found for any visits on 11/30/21.      Assessment & Plan:  Completed me assessment.  Patient reports she is improving after knee surgery.  No signs and symptoms of infection.  Patient is currently having physical therapy.  Current pain medication is controlling pain.  Patient knows to follow-up with signs and symptoms of tingling, numbness, increased redness and cough.  She is currently on aspirin 81 mg tablet by mouth daily.  Follow-up as needed. Problem List Items Addressed This Visit   None   No orders of the defined types were placed in this encounter.   Return if symptoms worsen or fail to improve.  Ivy Lynn, NP

## 2021-11-30 NOTE — Therapy (Signed)
OUTPATIENT PHYSICAL THERAPY LOWER EXTREMITY TREATMENT   Patient Name: Tamara Mendoza MRN: [id number] DOB:February 27, 1965, 56 y.o., female Today's Date: 11/30/2021   PT End of Session - 11/30/21 0956     Visit Number 3    Number of Visits 18    Date for PT Re-Evaluation 01/06/22    PT Start Time 0945    PT Stop Time 1031    PT Time Calculation (min) 46 min    Activity Tolerance Patient tolerated treatment well    Behavior During Therapy [facility] for tasks assessed/performed               Past Medical History:  Diagnosis Date   Anxiety and depression    Arthritis    Eczema    Mixed hyperlipidemia    Past Surgical History:  Procedure Laterality Date   BIOPSY  04/05/2020   Procedure: BIOPSY;  Surgeon: Eloise Harman, DO;  [facility];  Service: Endoscopy;;   BIOPSY  06/21/2020   Procedure: BIOPSY;  Surgeon: Irving Copas., MD;  Location: [facility];  Service: Gastroenterology;;   CESAREAN SECTION     COLONOSCOPY WITH PROPOFOL N/A 04/05/2020   Procedure: COLONOSCOPY WITH PROPOFOL;  Surgeon: Eloise Harman, DO;  [facility];  Service: Endoscopy;  Laterality: N/A;  11:00 ASA I/II, pt tested + 1/26   COLONOSCOPY WITH PROPOFOL N/A 06/21/2020   Procedure: COLONOSCOPY WITH PROPOFOL;  Surgeon: Rush Landmark Telford Nab., MD;  Location: [facility];  Service: Gastroenterology;  Laterality: N/A;   ENDOSCOPIC MUCOSAL RESECTION N/A 06/21/2020   Procedure: ENDOSCOPIC MUCOSAL RESECTION;  Surgeon: Rush Landmark Telford Nab., MD;  Location: [facility];  Service: Gastroenterology;  Laterality: N/A;   HEMOSTASIS CLIP PLACEMENT  06/21/2020   Procedure: HEMOSTASIS CLIP PLACEMENT;  Surgeon: Irving Copas., MD;  Location: [facility];  Service: Gastroenterology;;   POLYPECTOMY  04/05/2020   Procedure: POLYPECTOMY;  Surgeon: Eloise Harman, DO;  [facility];  Service: Endoscopy;;   POLYPECTOMY  06/21/2020   Procedure: POLYPECTOMY;  Surgeon: Irving Copas., MD;  Location: [facility];  Service: Gastroenterology;;   Patient Active Problem List   Diagnosis Date Noted   Bilateral primary osteoarthritis of knee 11/04/2020   Obesity (BMI 30.0-34.9) 09/09/2020   Arthritis 04/01/2020   Mixed hyperlipidemia    Difficulty sleeping 11/16/2019   Hot flashes 11/16/2019   Eczema of both hands 11/16/2019   Tobacco use 11/13/2019   Anxiety and depression 10/02/2019   REFERRING PROVIDER: Tillman Abide, PA-C  REFERRING DIAG: left knee replacement  THERAPY DIAG:  Stiffness of left knee, not elsewhere classified  Acute pain of left knee  Rationale for Evaluation and Treatment Rehabilitation  ONSET DATE: 11/16/21  SUBJECTIVE:   SUBJECTIVE STATEMENT: About the same. PERTINENT HISTORY: Will need R knee replacement, OA, anxiety, and depression  PAIN:  Are you having pain? Yes: NPRS scale: 5/10 Pain location: left knee Pain description: sore and aching Aggravating factors: doing a lot of activities Relieving factors: medication, ice  PRECAUTIONS: Ultrasound.  WEIGHT BEARING RESTRICTIONS No  FALLS:  Has patient fallen in last 6 months? No  LIVING ENVIRONMENT: Lives with: lives with their family Lives in: House/apartment Stairs:  yes, but she does not have to use them Has following equipment at home: Single point cane  OCCUPATION: full time; be able to lift and carry up to 10-15 pounds, climb a ladder  PLOF: Independent  PATIENT GOALS reduced pain, walk normally, and return to her normal work  and daily activities   OBJECTIVE:  TODAY'S TREATMENT: Date: 11/30/21:  Nustep at level 3 x 15 minutes moving seat forward x 2 to increase flexion.  In supine:  Gentle low load long duration stretching into left knee flexion and extension x 10 minutes.                                Modalities  Date:  Vaso: Knee, 34 degrees; low pressure, 15 mins, Pain and Edema    ASSESSMENT:  CLINICAL IMPRESSION: Patient did  very well today and is highly motivated.  Her left knee range of motion is improving nicely and significantly better since her initial evaluation.   GOALS: Goals reviewed with patient? No  SHORT TERM GOALS: Target date: 12/15/2021  Patient will be independent with her initial HEP.  Baseline: Goal status: INITIAL  2.  Patient will be able to demonstrate at least 95 degrees of active left knee flexion for improved mobility.  Baseline:  Goal status: INITIAL  3.  Patient will be able to demonstrate active left knee extension within 5 degrees of neutral for improved gait mechanics.  Baseline:  Goal status: INITIAL  4.  Patient will be able to complete her daily activities without her familiar pain exceeding 3/10.  Baseline:  Goal status: INITIAL  LONG TERM GOALS: Target date: 01/05/2022   Patient will be independent with her advanced HEP.  Baseline:  Goal status: INITIAL  2.  Patient will be able to demonstrate at least 120 degrees of active left knee flexion for improved mobility.  Baseline:  Goal status: INITIAL  3.  Patient will be able to safely ambulate at least 80 feet without an assistive device for improved functional mobility.  Baseline:  Goal status: INITIAL  4.  Patient will be able to ambulate with no significant gait deviations.  Baseline:  Goal status: INITIAL  PLAN: PT FREQUENCY: 3x/week  PT DURATION: 6 weeks  PLANNED INTERVENTIONS: Therapeutic exercises, Therapeutic activity, Neuromuscular re-education, Balance training, Gait training, Patient/Family education, Self Care, Joint mobilization, Stair training, Electrical stimulation, Cryotherapy, Moist heat, Vasopneumatic device, Manual therapy, and Re-evaluation  PLAN FOR NEXT SESSION: nustep, lower extremity strengthening, knee AROM, manual therapy, and modalities as needed   Kenniya Westrich, [place], PT 11/30/2021, 10:56 AM

## 2021-11-30 NOTE — Patient Instructions (Signed)
Wound Infection A wound infection happens when germs start to grow in a wound. Germs that cause wound infections are most often bacteria. Other types of infections can occur as well. An infection can cause the wound to break open. Wound infections need treatment. If a wound infection is not treated, problems can happen. What are the causes? Most often caused by germs (bacteria) that grow in a wound. Other germs, such as yeast and funguses, can also cause wound infections. What increases the risk? Having a weak body defense system (immune system). Having diabetes. Taking certain medicines (steroids) for a long time. Smoking. Being an older person. Being overweight. Taking certain medicines for cancer treatment. What are the signs or symptoms? Having more redness, swelling, or pain at the wound site. Having more blood or fluid at the wound site. A bad smell coming from a wound or bandage (dressing). Having a fever. Feeling very tired. Having warmth at or around the wound. Having pus at the wound site. How is this treated? This condition is most often treated with an antibiotic medicine. The infection should improve 24-48 hours after you start antibiotics. After 24-48 hours, redness around the wound should stop spreading. The wound should also be less painful. Follow these instructions at home: Medicines Take or apply over-the-counter and prescription medicines only as told by your doctor. If you were prescribed an antibiotic medicine, take or apply it as told by your doctor. Do not stop using the antibiotic even if you start to feel better. Wound care  Clean the wound each day, or as told by your doctor. Wash the wound with mild soap and water. Rinse the wound with water to remove all soap. Pat the wound dry with a clean towel. Do not rub it. Follow instructions from your doctor about how to take care of your wound. Make sure you: Wash your hands with soap and water before and after  you change your bandage. If you cannot use soap and water, use hand sanitizer. Change your bandage as told by your doctor. Leave stitches (sutures), skin glue, or skin tape (adhesive) strips in place if your wound has been closed. They may need to stay in place for 2 weeks or longer. If tape strips get loose and curl up, you may trim the loose edges. Do not remove tape strips completely unless your doctor says it is okay. Some wounds are left open to heal on their own. Check your wound every day for signs of infection. Watch for: More redness, swelling, or pain. More fluid or blood. Warmth. Pus or a bad smell. General instructions Keep the bandage dry until your doctor says it can be removed. Do not take baths, swim, or use a hot tub until your doctor approves. Ask your doctor if you may take showers. You may only be allowed to take sponge baths. Raise (elevate) the injured area above the level of your heart while you are sitting or lying down. Do not scratch or pick at the wound. Keep all follow-up visits as told by your doctor. This is important. Contact a doctor if: Medicine does not help your pain. You have more redness, swelling, or pain around your wound. You have more fluid or blood coming from your wound. Your wound feels warm to the touch. You have pus coming from your wound. You notice a bad smell coming from your wound or your bandage. Your wound that was closed breaks open. Get help right away if: You have a red streak  going away from your wound. You have a fever. Summary A wound infection happens when germs start to grow in a wound. This condition is usually treated with an antibiotic medicine. Follow instructions from your doctor about how to take care of your wound. Contact a doctor if your wound infection does not start to get better in 24-48 hours, or your symptoms get worse. Keep all follow-up visits as told by your doctor. This is important. This information is not  intended to replace advice given to you by your health care provider. Make sure you discuss any questions you have with your health care provider. Document Revised: 11/25/2020 Document Reviewed: 11/25/2020 Elsevier Patient Education  [place].

## 2021-12-02 ENCOUNTER — Encounter: Payer: BC Managed Care – PPO | Admitting: Physical Therapy

## 2021-12-07 ENCOUNTER — Encounter: Payer: Self-pay | Admitting: Physical Therapy

## 2021-12-07 ENCOUNTER — Ambulatory Visit: Payer: BC Managed Care – PPO | Admitting: Physical Therapy

## 2021-12-07 DIAGNOSIS — M25662 Stiffness of left knee, not elsewhere classified: Secondary | ICD-10-CM

## 2021-12-07 DIAGNOSIS — M25562 Pain in left knee: Secondary | ICD-10-CM

## 2021-12-07 NOTE — Therapy (Signed)
OUTPATIENT PHYSICAL THERAPY LOWER EXTREMITY TREATMENT   Patient Name: Tamara Mendoza MRN: [id number] DOB:1965-07-28, 56 y.o., female Today's Date: 12/07/2021   PT End of Session - 12/07/21 0947     Visit Number 4    Number of Visits 18    Date for PT Re-Evaluation 01/06/22    PT Start Time 0947    PT Stop Time 1027    PT Time Calculation (min) 40 min    Activity Tolerance Patient tolerated treatment well    Behavior During Therapy [facility] for tasks assessed/performed            Past Medical History:  Diagnosis Date   Anxiety and depression    Arthritis    Eczema    Mixed hyperlipidemia    Past Surgical History:  Procedure Laterality Date   BIOPSY  04/05/2020   Procedure: BIOPSY;  Surgeon: Eloise Harman, DO;  [facility];  Service: Endoscopy;;   BIOPSY  06/21/2020   Procedure: BIOPSY;  Surgeon: Irving Copas., MD;  Location: [facility];  Service: Gastroenterology;;   CESAREAN SECTION     COLONOSCOPY WITH PROPOFOL N/A 04/05/2020   Procedure: COLONOSCOPY WITH PROPOFOL;  Surgeon: Eloise Harman, DO;  [facility];  Service: Endoscopy;  Laterality: N/A;  11:00 ASA I/II, pt tested + 1/26   COLONOSCOPY WITH PROPOFOL N/A 06/21/2020   Procedure: COLONOSCOPY WITH PROPOFOL;  Surgeon: Rush Landmark Telford Nab., MD;  Location: [facility];  Service: Gastroenterology;  Laterality: N/A;   ENDOSCOPIC MUCOSAL RESECTION N/A 06/21/2020   Procedure: ENDOSCOPIC MUCOSAL RESECTION;  Surgeon: Rush Landmark Telford Nab., MD;  Location: [facility];  Service: Gastroenterology;  Laterality: N/A;   HEMOSTASIS CLIP PLACEMENT  06/21/2020   Procedure: HEMOSTASIS CLIP PLACEMENT;  Surgeon: Irving Copas., MD;  Location: [facility];  Service: Gastroenterology;;   POLYPECTOMY  04/05/2020   Procedure: POLYPECTOMY;  Surgeon: Eloise Harman, DO;  [facility];  Service: Endoscopy;;   POLYPECTOMY  06/21/2020   Procedure: POLYPECTOMY;  Surgeon: Irving Copas., MD;  Location: [facility];  Service: Gastroenterology;;   Patient Active Problem List   Diagnosis Date Noted   Bilateral primary osteoarthritis of knee 11/04/2020   Obesity (BMI 30.0-34.9) 09/09/2020   Arthritis 04/01/2020   Mixed hyperlipidemia    Difficulty sleeping 11/16/2019   Hot flashes 11/16/2019   Eczema of both hands 11/16/2019   Tobacco use 11/13/2019   Anxiety and depression 10/02/2019   REFERRING PROVIDER: Tillman Abide, PA-C  REFERRING DIAG: left knee replacement  THERAPY DIAG:  Stiffness of left knee, not elsewhere classified  Acute pain of left knee  Rationale for Evaluation and Treatment Rehabilitation  ONSET DATE: 11/16/21  SUBJECTIVE:   SUBJECTIVE STATEMENT: Has been trying to keep her knee straight which makes it uncomfortable to move again when walking.  PERTINENT HISTORY: Will need R knee replacement, OA, anxiety, and depression  PAIN:  Are you having pain? Yes: NPRS scale: 3/10 Pain location: left knee Pain description: sore and aching Aggravating factors: doing a lot of activities Relieving factors: medication, ice  PRECAUTIONS: Ultrasound.  PATIENT GOALS reduced pain, walk normally, and return to her normal work and daily activities  OBJECTIVE:  TODAY'S TREATMENT:  AROM Left 10/25  Hip flexion   Hip extension   Hip abduction   Hip adduction   Hip internal rotation   Hip external rotation   Knee flexion 118  Knee extension 0  Ankle dorsiflexion   Ankle plantarflexion  Ankle inversion   Ankle eversion    (Blank rows = not tested)                                     EXERCISE LOG  Exercise Repetitions and Resistance Comments  Nustep L3, seat 5 x15 min   Rockerboard X3 min   Lunges 8" step x20 reps   Forward step up 6" step leading with LLE x15 reps VCs to avoid hip circumduction  LAQ 4# x20 reps    Blank cell = exercise not performed today                                  Modalities  Date: 10/25 Vaso: Knee,  34 degrees; low pressure, 15 mins, Pain and Edema  ASSESSMENT:  CLINICAL IMPRESSION: Patient continues to do well with PT and has not met STGs for ROM and using on [facility] for ambulation. Patient experiencing more stiffness with sitting postures per reports. Patient VCs and demo'd to avoid hip circumduction for step ups. Good quad activation and strength noted with resisted LAQ  with toe raise. Patient's L knee ROM measured as 0-118 deg. Normal modalities response noted following removal of the modality.  GOALS: Goals reviewed with patient? No  SHORT TERM GOALS: Target date: 12/15/2021  Patient will be independent with her initial HEP.  Baseline: Goal status: On-going  2.  Patient will be able to demonstrate at least 95 degrees of active left knee flexion for improved mobility.  Baseline:  Goal status: MET  3.  Patient will be able to demonstrate active left knee extension within 5 degrees of neutral for improved gait mechanics.  Baseline:  Goal status: MET  4.  Patient will be able to complete her daily activities without her familiar pain exceeding 3/10.  Baseline:  Goal status: On-going  LONG TERM GOALS: Target date: 01/05/2022   Patient will be independent with her advanced HEP.  Baseline:  Goal status: On-going  2.  Patient will be able to demonstrate at least 120 degrees of active left knee flexion for improved mobility.  Baseline:  Goal status: On-going  3.  Patient will be able to safely ambulate at least 80 feet without an assistive device for improved functional mobility.  Baseline:  Goal status: On-going  4.  Patient will be able to ambulate with no significant gait deviations.  Baseline:  Goal status: On-going   PLAN: PT FREQUENCY: 3x/week  PT DURATION: 6 weeks  PLANNED INTERVENTIONS: Therapeutic exercises, Therapeutic activity, Neuromuscular re-education, Balance training, Gait training, Patient/Family education, Self Care, Joint mobilization, Stair training,  Electrical stimulation, Cryotherapy, Moist heat, Vasopneumatic device, Manual therapy, and Re-evaluation  PLAN FOR NEXT SESSION: nustep, lower extremity strengthening, knee AROM, manual therapy, and modalities as needed   Standley Brooking, PTA 12/07/2021, 10:41 AM

## 2021-12-08 ENCOUNTER — Ambulatory Visit: Payer: BC Managed Care – PPO

## 2021-12-08 DIAGNOSIS — M25562 Pain in left knee: Secondary | ICD-10-CM | POA: Diagnosis not present

## 2021-12-08 DIAGNOSIS — M25662 Stiffness of left knee, not elsewhere classified: Secondary | ICD-10-CM

## 2021-12-08 NOTE — Therapy (Signed)
OUTPATIENT PHYSICAL THERAPY LOWER EXTREMITY TREATMENT   Patient Name: Tamara Mendoza MRN: [id number] DOB:October 01, 1965, 56 y.o., female Today's Date: 12/08/2021   PT End of Session - 12/08/21 0950     Visit Number 5    Number of Visits 18    Date for PT Re-Evaluation 01/06/22    PT Start Time 0945    PT Stop Time 1037    PT Time Calculation (min) 52 min    Activity Tolerance Patient tolerated treatment well    Behavior During Therapy [facility] for tasks assessed/performed             Past Medical History:  Diagnosis Date   Anxiety and depression    Arthritis    Eczema    Mixed hyperlipidemia    Past Surgical History:  Procedure Laterality Date   BIOPSY  04/05/2020   Procedure: BIOPSY;  Surgeon: Eloise Harman, DO;  [facility];  Service: Endoscopy;;   BIOPSY  06/21/2020   Procedure: BIOPSY;  Surgeon: Irving Copas., MD;  Location: [facility];  Service: Gastroenterology;;   CESAREAN SECTION     COLONOSCOPY WITH PROPOFOL N/A 04/05/2020   Procedure: COLONOSCOPY WITH PROPOFOL;  Surgeon: Eloise Harman, DO;  [facility];  Service: Endoscopy;  Laterality: N/A;  11:00 ASA I/II, pt tested + 1/26   COLONOSCOPY WITH PROPOFOL N/A 06/21/2020   Procedure: COLONOSCOPY WITH PROPOFOL;  Surgeon: Rush Landmark Telford Nab., MD;  [facility];  Service: Gastroenterology;  Laterality: N/A;   ENDOSCOPIC MUCOSAL RESECTION N/A 06/21/2020   Procedure: ENDOSCOPIC MUCOSAL RESECTION;  Surgeon: Rush Landmark Telford Nab., MD;  [facility];  Service: Gastroenterology;  Laterality: N/A;   HEMOSTASIS CLIP PLACEMENT  06/21/2020   Procedure: HEMOSTASIS CLIP PLACEMENT;  Surgeon: Irving Copas., MD;  Location: [facility];  Service: Gastroenterology;;   POLYPECTOMY  04/05/2020   Procedure: POLYPECTOMY;  Surgeon: Eloise Harman, DO;  [facility];  Service: Endoscopy;;   POLYPECTOMY  06/21/2020   Procedure: POLYPECTOMY;  Surgeon: Irving Copas., MD;  Location: [facility];  Service: Gastroenterology;;   Patient Active Problem List   Diagnosis Date Noted   Bilateral primary osteoarthritis of knee 11/04/2020   Obesity (BMI 30.0-34.9) 09/09/2020   Arthritis 04/01/2020   Mixed hyperlipidemia    Difficulty sleeping 11/16/2019   Hot flashes 11/16/2019   Eczema of both hands 11/16/2019   Tobacco use 11/13/2019   Anxiety and depression 10/02/2019   REFERRING PROVIDER: Tillman Abide, PA-C  REFERRING DIAG: left knee replacement  THERAPY DIAG:  Stiffness of left knee, not elsewhere classified  Acute pain of left knee  Rationale for Evaluation and Treatment Rehabilitation  ONSET DATE: 11/16/21  SUBJECTIVE:   SUBJECTIVE STATEMENT: Patient reports that her knee was doing alright until she got up to go to the bathroom last night. She notes that she sat down quickly and it felt like "I ripped everything in my knee."   PERTINENT HISTORY: Will need R knee replacement, OA, anxiety, and depression  PAIN:  Are you having pain? Yes: NPRS scale: 6/10 Pain location: left knee Pain description: sore and aching Aggravating factors: doing a lot of activities Relieving factors: medication, ice  PRECAUTIONS: Ultrasound.  PATIENT GOALS reduced pain, walk normally, and return to her normal work and daily activities  OBJECTIVE:  TODAY'S TREATMENT:  10/26 EXERCISE LOG  Exercise Repetitions and Resistance Comments  Nustep L4 x 17 minutes; seat 8   Rocker board 5 minutes   LAQ  30 reps            Blank cell = exercise not performed today  Manual Therapy Soft Tissue Mobilization: left quadriceps and along incision, for improved knee mobility Joint Mobilizations: patellar , grade I-III Passive ROM: flexion and extension, to tolerance   Modalities  Date:  Vaso: Knee, 34 degrees; low pressure, 10 mins, Pain and Edema  ASSESSMENT:  CLINICAL IMPRESSION: Treatment focused on  familiar interventions for improved lower extremity mobility for improved function with her daily activities. Manual therapy focused on improved knee flexion through the use of patellar joint mobilizations and soft tissue mobilization to the quadriceps followed by PROM. She reported feeling better upon the conclusion of treatment. She continues to require skilled physical therapy to address her remaining impairments to return to her prior level of function.   GOALS: Goals reviewed with patient? No  SHORT TERM GOALS: Target date: 12/15/2021  Patient will be independent with her initial HEP.  Baseline: Goal status: On-going  2.  Patient will be able to demonstrate at least 95 degrees of active left knee flexion for improved mobility.  Baseline:  Goal status: MET  3.  Patient will be able to demonstrate active left knee extension within 5 degrees of neutral for improved gait mechanics.  Baseline:  Goal status: MET  4.  Patient will be able to complete her daily activities without her familiar pain exceeding 3/10.  Baseline:  Goal status: On-going  LONG TERM GOALS: Target date: 01/05/2022   Patient will be independent with her advanced HEP.  Baseline:  Goal status: On-going  2.  Patient will be able to demonstrate at least 120 degrees of active left knee flexion for improved mobility.  Baseline:  Goal status: On-going  3.  Patient will be able to safely ambulate at least 80 feet without an assistive device for improved functional mobility.  Baseline:  Goal status: On-going  4.  Patient will be able to ambulate with no significant gait deviations.  Baseline:  Goal status: On-going   PLAN: PT FREQUENCY: 3x/week  PT DURATION: 6 weeks  PLANNED INTERVENTIONS: Therapeutic exercises, Therapeutic activity, Neuromuscular re-education, Balance training, Gait training, Patient/Family education, Self Care, Joint mobilization, Stair training, Electrical stimulation, Cryotherapy, Moist  heat, Vasopneumatic device, Manual therapy, and Re-evaluation  PLAN FOR NEXT SESSION: nustep, lower extremity strengthening, knee AROM, manual therapy, and modalities as needed   Darlin Coco, PT 12/08/2021, 12:26 PM

## 2021-12-09 ENCOUNTER — Other Ambulatory Visit: Payer: Self-pay | Admitting: Nurse Practitioner

## 2021-12-09 ENCOUNTER — Telehealth: Payer: Self-pay | Admitting: Nurse Practitioner

## 2021-12-09 DIAGNOSIS — M25562 Pain in left knee: Secondary | ICD-10-CM

## 2021-12-09 MED ORDER — OXYCODONE HCL 5 MG PO TABS: 5.0000 mg | ORAL_TABLET | ORAL | 0 refills | Status: AC | PRN

## 2021-12-09 NOTE — Telephone Encounter (Signed)
Rx sent to pharmacy   

## 2021-12-09 NOTE — Telephone Encounter (Signed)
Patient is new to me from [place] and hand knee surgery. We discussed short term pain management after surgery, Looks like a provider refilled pain medication 11/30/21 For 7 days.I have no controlled substance contract with patient, I can cover 5 days of pain medication. And if patient will need long term/chronic pain management , I will have to refer to pain management clinic. After today's refill.

## 2021-12-09 NOTE — Telephone Encounter (Signed)
Pt aware rx has been sent 

## 2021-12-14 ENCOUNTER — Ambulatory Visit: Payer: BC Managed Care – PPO | Attending: Orthopaedic Surgery | Admitting: Physical Therapy

## 2021-12-14 ENCOUNTER — Encounter: Payer: Self-pay | Admitting: Physical Therapy

## 2021-12-14 DIAGNOSIS — M25562 Pain in left knee: Secondary | ICD-10-CM | POA: Diagnosis not present

## 2021-12-14 DIAGNOSIS — M25662 Stiffness of left knee, not elsewhere classified: Secondary | ICD-10-CM | POA: Diagnosis not present

## 2021-12-14 NOTE — Therapy (Signed)
OUTPATIENT PHYSICAL THERAPY LOWER EXTREMITY TREATMENT   Patient Name: Tamara Mendoza MRN: [id number] DOB:[birth year], 56 y.o., female Today's Date: 12/14/2021   PT End of Session - 12/14/21 0948     Visit Number 6    Number of Visits 18    Date for PT Re-Evaluation 01/06/22    PT Start Time 0946    PT Stop Time 1026    PT Time Calculation (min) 40 min    Activity Tolerance Patient tolerated treatment well    Behavior During Therapy [facility] for tasks assessed/performed             Past Medical History:  Diagnosis Date   Anxiety and depression    Arthritis    Eczema    Mixed hyperlipidemia    Past Surgical History:  Procedure Laterality Date   BIOPSY  04/05/2020   Procedure: BIOPSY;  Surgeon: Eloise Harman, DO;  [facility];  Service: Endoscopy;;   BIOPSY  06/21/2020   Procedure: BIOPSY;  Surgeon: Irving Copas., MD;  Location: [facility];  Service: Gastroenterology;;   CESAREAN SECTION     COLONOSCOPY WITH PROPOFOL N/A 04/05/2020   Procedure: COLONOSCOPY WITH PROPOFOL;  Surgeon: Eloise Harman, DO;  [facility];  Service: Endoscopy;  Laterality: N/A;  11:00 ASA I/II, pt tested + 1/26   COLONOSCOPY WITH PROPOFOL N/A 06/21/2020   Procedure: COLONOSCOPY WITH PROPOFOL;  Surgeon: Rush Landmark Telford Nab., MD;  [facility]: [facility];  Service: Gastroenterology;  Laterality: N/A;   ENDOSCOPIC MUCOSAL RESECTION N/A 06/21/2020   Procedure: ENDOSCOPIC MUCOSAL RESECTION;  Surgeon: Rush Landmark Telford Nab., MD;  Location: [facility];  Service: Gastroenterology;  Laterality: N/A;   HEMOSTASIS CLIP PLACEMENT  06/21/2020   Procedure: HEMOSTASIS CLIP PLACEMENT;  Surgeon: Irving Copas., MD;  Location: [facility];  Service: Gastroenterology;;   POLYPECTOMY  04/05/2020   Procedure: POLYPECTOMY;  Surgeon: Eloise Harman, DO;  [facility];  Service: Endoscopy;;   POLYPECTOMY  06/21/2020   Procedure: POLYPECTOMY;  Surgeon: Irving Copas., MD;  Location: [facility];  Service: Gastroenterology;;   Patient Active Problem List   Diagnosis Date Noted   Bilateral primary osteoarthritis of knee 11/04/2020   Obesity (BMI 30.0-34.9) 09/09/2020   Arthritis 04/01/2020   Mixed hyperlipidemia    Difficulty sleeping 11/16/2019   Hot flashes 11/16/2019   Eczema of both hands 11/16/2019   Tobacco use 11/13/2019   Anxiety and depression 10/02/2019   REFERRING PROVIDER: Tillman Abide, PA-C  REFERRING DIAG: left knee replacement  THERAPY DIAG:  Stiffness of left knee, not elsewhere classified  Acute pain of left knee  Rationale for Evaluation and Treatment Rehabilitation  ONSET DATE: 11/16/21  SUBJECTIVE:   SUBJECTIVE STATEMENT: No problems at home.  PERTINENT HISTORY: Will need R knee replacement, OA, anxiety, and depression  PAIN:  Are you having pain? No.  PRECAUTIONS: Ultrasound.  PATIENT GOALS reduced pain, walk normally, and return to her normal work and daily activities  OBJECTIVE:  TODAY'S TREATMENT:                                   11/1 EXERCISE LOG  Exercise Repetitions and Resistance Comments  Nustep L4 x 17 minutes; seat 8   Rocker board 3 minutes   LAQ  30 reps 4#   Lunges 14" step x20 reps   Mini squats X10 reps   Hip abduction BLE  x20 reps    Blank cell = exercise not performed today   Modalities  Date: 12/14/21 Vaso: Knee, 34 degrees; low pressure, 10 mins, Pain and Edema  ASSESSMENT:  CLINICAL IMPRESSION: Patient presented in clinic with reports of no L knee pain upon arrival. Patient trying to get away from using AD but takes in case of long distances or stairs. Patient guided through strengthening exercises to return to function and ROM. Patient able to tolerate well other than hip discomfort with standing hip abduction. Patient states that overall independence with dressing is improving as she noted this morning that it was easier for her to don socks and shoes by  herself. Normal vasopneumatic response noted following removal of the modality.  GOALS: Goals reviewed with patient? No  SHORT TERM GOALS: Target date: 12/15/2021  Patient will be independent with her initial HEP.  Baseline: Goal status: On-going  2.  Patient will be able to demonstrate at least 95 degrees of active left knee flexion for improved mobility.  Baseline:  Goal status: MET  3.  Patient will be able to demonstrate active left knee extension within 5 degrees of neutral for improved gait mechanics.  Baseline:  Goal status: MET  4.  Patient will be able to complete her daily activities without her familiar pain exceeding 3/10.  Baseline:  Goal status: MET  LONG TERM GOALS: Target date: 01/05/2022   Patient will be independent with her advanced HEP.  Baseline:  Goal status: On-going  2.  Patient will be able to demonstrate at least 120 degrees of active left knee flexion for improved mobility.  Baseline:  Goal status: On-going  3.  Patient will be able to safely ambulate at least 80 feet without an assistive device for improved functional mobility.  Baseline:  Goal status: On-going  4.  Patient will be able to ambulate with no significant gait deviations.  Baseline:  Goal status: On-going   PLAN: PT FREQUENCY: 3x/week  PT DURATION: 6 weeks  PLANNED INTERVENTIONS: Therapeutic exercises, Therapeutic activity, Neuromuscular re-education, Balance training, Gait training, Patient/Family education, Self Care, Joint mobilization, Stair training, Electrical stimulation, Cryotherapy, Moist heat, Vasopneumatic device, Manual therapy, and Re-evaluation  PLAN FOR NEXT SESSION: nustep, lower extremity strengthening, knee AROM, manual therapy, and modalities as needed   Standley Brooking, PTA 12/14/2021, 10:28 AM

## 2021-12-22 ENCOUNTER — Ambulatory Visit: Payer: BC Managed Care – PPO | Admitting: Physical Therapy

## 2021-12-22 ENCOUNTER — Encounter: Payer: Self-pay | Admitting: Physical Therapy

## 2021-12-22 DIAGNOSIS — M25562 Pain in left knee: Secondary | ICD-10-CM

## 2021-12-22 DIAGNOSIS — M25662 Stiffness of left knee, not elsewhere classified: Secondary | ICD-10-CM

## 2021-12-22 NOTE — Therapy (Signed)
OUTPATIENT PHYSICAL THERAPY LOWER EXTREMITY TREATMENT   Patient Name: Tamara Mendoza MRN: [id number] DOB:[birth year], 56 y.o., female Today's Date: 12/22/2021   PT End of Session - 12/22/21 1032     Visit Number 7    Number of Visits 18    Date for PT Re-Evaluation 01/06/22    PT Start Time 1033    PT Stop Time 1114    PT Time Calculation (min) 41 min    Activity Tolerance Patient tolerated treatment well    Behavior During Therapy [facility] for tasks assessed/performed             Past Medical History:  Diagnosis Date   Anxiety and depression    Arthritis    Eczema    Mixed hyperlipidemia    Past Surgical History:  Procedure Laterality Date   BIOPSY  04/05/2020   Procedure: BIOPSY;  Surgeon: Eloise Harman, DO;  [facility];  Service: Endoscopy;;   BIOPSY  06/21/2020   Procedure: BIOPSY;  Surgeon: Irving Copas., MD;  Location: [facility];  Service: Gastroenterology;;   CESAREAN SECTION     COLONOSCOPY WITH PROPOFOL N/A 04/05/2020   Procedure: COLONOSCOPY WITH PROPOFOL;  Surgeon: Eloise Harman, DO;  [facility];  Service: Endoscopy;  Laterality: N/A;  11:00 ASA I/II, pt tested + 1/26   COLONOSCOPY WITH PROPOFOL N/A 06/21/2020   Procedure: COLONOSCOPY WITH PROPOFOL;  Surgeon: Rush Landmark Telford Nab., MD;  Location: [facility];  Service: Gastroenterology;  Laterality: N/A;   ENDOSCOPIC MUCOSAL RESECTION N/A 06/21/2020   Procedure: ENDOSCOPIC MUCOSAL RESECTION;  Surgeon: Rush Landmark Telford Nab., MD;  Location: [facility];  Service: Gastroenterology;  Laterality: N/A;   HEMOSTASIS CLIP PLACEMENT  06/21/2020   Procedure: HEMOSTASIS CLIP PLACEMENT;  Surgeon: Irving Copas., MD;  Location: [facility];  Service: Gastroenterology;;   POLYPECTOMY  04/05/2020   Procedure: POLYPECTOMY;  Surgeon: Eloise Harman, DO;  [facility];  Service: Endoscopy;;   POLYPECTOMY  06/21/2020   Procedure: POLYPECTOMY;  Surgeon: Irving Copas., MD;  Location: [facility];  Service: Gastroenterology;;   Patient Active Problem List   Diagnosis Date Noted   Bilateral primary osteoarthritis of knee 11/04/2020   Obesity (BMI 30.0-34.9) 09/09/2020   Arthritis 04/01/2020   Mixed hyperlipidemia    Difficulty sleeping 11/16/2019   Hot flashes 11/16/2019   Eczema of both hands 11/16/2019   Tobacco use 11/13/2019   Anxiety and depression 10/02/2019   REFERRING PROVIDER: Tillman Abide, PA-C  REFERRING DIAG: left knee replacement  THERAPY DIAG:  Stiffness of left knee, not elsewhere classified  Acute pain of left knee  Rationale for Evaluation and Treatment Rehabilitation  ONSET DATE: 11/16/21  SUBJECTIVE:   SUBJECTIVE STATEMENT: No problems at home.  PERTINENT HISTORY: Will need R knee replacement, OA, anxiety, and depression  PAIN:  Are you having pain? No.  PRECAUTIONS: Ultrasound.  PATIENT GOALS reduced pain, walk normally, and return to her normal work and daily activities  OBJECTIVE:   AROM  Left 11/9  Hip flexion    Hip extension    Hip abduction    Hip adduction    Hip internal rotation    Hip external rotation    Knee flexion  128  Knee extension    Ankle dorsiflexion    Ankle plantarflexion    Ankle inversion    Ankle eversion     (Blank rows = not tested)  TODAY'S TREATMENT:  11/9 EXERCISE LOG  Exercise Repetitions and Resistance Comments  Bike L1 x 15 minutes; seat 5   Rocker board 2 minutes   LAQ  20 reps 5#   Lunges 8" step x20 reps   Mini squats X10 reps   Hip abduction BLE x20 reps    Blank cell = exercise not performed today   Modalities  Date: 12/14/21 Vaso: Knee, 34 degrees; low pressure, 10 mins, Pain and Edema  ASSESSMENT:  CLINICAL IMPRESSION: Patientreporting more R knee pain but is going to wait a while for TKR on R knee. Patient able to tolerate all therex fairly well although at times limited by R knee. AROM L  knee  flexion measured as 128 deg. Normal vasopneumatic response noted following removal of the modality.  GOALS: Goals reviewed with patient? No  SHORT TERM GOALS: Target date: 12/15/2021  Patient will be independent with her initial HEP.  Baseline: Goal status: On-going  2.  Patient will be able to demonstrate at least 95 degrees of active left knee flexion for improved mobility.  Baseline:  Goal status: MET  3.  Patient will be able to demonstrate active left knee extension within 5 degrees of neutral for improved gait mechanics.  Baseline:  Goal status: MET  4.  Patient will be able to complete her daily activities without her familiar pain exceeding 3/10.  Baseline:  Goal status: MET  LONG TERM GOALS: Target date: 01/05/2022   Patient will be independent with her advanced HEP.  Baseline:  Goal status: On-going  2.  Patient will be able to demonstrate at least 120 degrees of active left knee flexion for improved mobility.  Baseline:  Goal status: On-going  3.  Patient will be able to safely ambulate at least 80 feet without an assistive device for improved functional mobility.  Baseline:  Goal status: On-going  4.  Patient will be able to ambulate with no significant gait deviations.  Baseline:  Goal status: On-going   PLAN: PT FREQUENCY: 3x/week  PT DURATION: 6 weeks  PLANNED INTERVENTIONS: Therapeutic exercises, Therapeutic activity, Neuromuscular re-education, Balance training, Gait training, Patient/Family education, Self Care, Joint mobilization, Stair training, Electrical stimulation, Cryotherapy, Moist heat, Vasopneumatic device, Manual therapy, and Re-evaluation  PLAN FOR NEXT SESSION: nustep, lower extremity strengthening, knee AROM, manual therapy, and modalities as needed   Standley Brooking, PTA 12/22/2021, 11:36 AM

## 2021-12-26 ENCOUNTER — Ambulatory Visit: Payer: BC Managed Care – PPO

## 2021-12-26 DIAGNOSIS — M25662 Stiffness of left knee, not elsewhere classified: Secondary | ICD-10-CM | POA: Diagnosis not present

## 2021-12-26 DIAGNOSIS — M25562 Pain in left knee: Secondary | ICD-10-CM

## 2021-12-26 NOTE — Therapy (Signed)
OUTPATIENT PHYSICAL THERAPY LOWER EXTREMITY TREATMENT   Patient Name: Tamara Mendoza MRN: [id number] DOB:May 28, 1965, 56 y.o., female Today's Date: 12/26/2021   PT End of Session - 12/26/21 1051     Visit Number 8    Number of Visits 18    Date for PT Re-Evaluation 01/06/22    PT Start Time 1030    PT Stop Time 1115    PT Time Calculation (min) 45 min    Activity Tolerance Patient tolerated treatment well    Behavior During Therapy [facility] for tasks assessed/performed             Past Medical History:  Diagnosis Date   Anxiety and depression    Arthritis    Eczema    Mixed hyperlipidemia    Past Surgical History:  Procedure Laterality Date   BIOPSY  04/05/2020   Procedure: BIOPSY;  Surgeon: Eloise Harman, DO;  [facility];  Service: Endoscopy;;   BIOPSY  06/21/2020   Procedure: BIOPSY;  Surgeon: Irving Copas., MD;  Location: [facility];  Service: Gastroenterology;;   CESAREAN SECTION     COLONOSCOPY WITH PROPOFOL N/A 04/05/2020   Procedure: COLONOSCOPY WITH PROPOFOL;  Surgeon: Eloise Harman, DO;  [facility];  Service: Endoscopy;  Laterality: N/A;  11:00 ASA I/II, pt tested + 1/26   COLONOSCOPY WITH PROPOFOL N/A 06/21/2020   Procedure: COLONOSCOPY WITH PROPOFOL;  Surgeon: Rush Landmark Telford Nab., MD;  Location: [facility];  Service: Gastroenterology;  Laterality: N/A;   ENDOSCOPIC MUCOSAL RESECTION N/A 06/21/2020   Procedure: ENDOSCOPIC MUCOSAL RESECTION;  Surgeon: Rush Landmark Telford Nab., MD;  Location: [facility];  Service: Gastroenterology;  Laterality: N/A;   HEMOSTASIS CLIP PLACEMENT  06/21/2020   Procedure: HEMOSTASIS CLIP PLACEMENT;  Surgeon: Irving Copas., MD;  Location: [facility];  Service: Gastroenterology;;   POLYPECTOMY  04/05/2020   Procedure: POLYPECTOMY;  Surgeon: Eloise Harman, DO;  [facility];  Service: Endoscopy;;   POLYPECTOMY  06/21/2020   Procedure: POLYPECTOMY;  Surgeon: Irving Copas., MD;  Location: [facility];  Service: Gastroenterology;;   Patient Active Problem List   Diagnosis Date Noted   Bilateral primary osteoarthritis of knee 11/04/2020   Obesity (BMI 30.0-34.9) 09/09/2020   Arthritis 04/01/2020   Mixed hyperlipidemia    Difficulty sleeping 11/16/2019   Hot flashes 11/16/2019   Eczema of both hands 11/16/2019   Tobacco use 11/13/2019   Anxiety and depression 10/02/2019   REFERRING PROVIDER: Tillman Abide, PA-C  REFERRING DIAG: left knee replacement  THERAPY DIAG:  Stiffness of left knee, not elsewhere classified  Acute pain of left knee  Rationale for Evaluation and Treatment Rehabilitation  ONSET DATE: 11/16/21  SUBJECTIVE:   SUBJECTIVE STATEMENT: Patient reports that her knee is doing better. She notes that she is able to walk more around her house.   PERTINENT HISTORY: Will need R knee replacement, OA, anxiety, and depression  PAIN:  Are you having pain? No.  PRECAUTIONS: Ultrasound.  PATIENT GOALS reduced pain, walk normally, and return to her normal work and daily activities  OBJECTIVE:   AROM  Left 11/9  Hip flexion    Hip extension    Hip abduction    Hip adduction    Hip internal rotation    Hip external rotation    Knee flexion  128  Knee extension    Ankle dorsiflexion    Ankle plantarflexion    Ankle inversion    Ankle eversion     (  Blank rows = not tested)  TODAY'S TREATMENT:                                   11/13 EXERCISE LOG  Exercise Repetitions and Resistance Comments  Nustep  L4 x 15 minutes; seat 7-5   Cybex knee extension 10# x 2 minutes   Cybex knee flexion 30# x 3 minutes   Standing HS stretch  3 x 30 seconds    Step up  6" step x 15 reps each    Rocker board  4.5 minutes         Blank cell = exercise not performed today   Modalities  Date:  Vaso: Knee, 34 degrees; low pressure, 10 mins, Pain                                   11/9 EXERCISE LOG  Exercise Repetitions and  Resistance Comments  Bike L1 x 15 minutes; seat 5   Rocker board 2 minutes   LAQ  20 reps 5#   Lunges 8" step x20 reps   Mini squats X10 reps   Hip abduction BLE x20 reps    Blank cell = exercise not performed today   Modalities  Date: 12/14/21 Vaso: Knee, 34 degrees; low pressure, 10 mins, Pain and Edema  ASSESSMENT:  CLINICAL IMPRESSION: Patient was progressed with multiple new and familiar interventions for improved lower extremity strength and mobility with moderate difficulty. She required minimal cueing with weighted knee flexion for eccentric hamstring control to facilitate increased muscular demand. She experienced no increase in pain with any of today's interventions. She reported that her knee felt alright upon the conclusion of treatment. She continues to require skilled physical therapy to address her remaining impairments to return to her prior level of function.   GOALS: Goals reviewed with patient? No  SHORT TERM GOALS: Target date: 12/15/2021  Patient will be independent with her initial HEP.  Baseline: Goal status: On-going  2.  Patient will be able to demonstrate at least 95 degrees of active left knee flexion for improved mobility.  Baseline:  Goal status: MET  3.  Patient will be able to demonstrate active left knee extension within 5 degrees of neutral for improved gait mechanics.  Baseline:  Goal status: MET  4.  Patient will be able to complete her daily activities without her familiar pain exceeding 3/10.  Baseline:  Goal status: MET  LONG TERM GOALS: Target date: 01/05/2022   Patient will be independent with her advanced HEP.  Baseline:  Goal status: On-going  2.  Patient will be able to demonstrate at least 120 degrees of active left knee flexion for improved mobility.  Baseline:  Goal status: On-going  3.  Patient will be able to safely ambulate at least 80 feet without an assistive device for improved functional mobility.  Baseline:  Goal  status: On-going  4.  Patient will be able to ambulate with no significant gait deviations.  Baseline:  Goal status: On-going   PLAN: PT FREQUENCY: 3x/week  PT DURATION: 6 weeks  PLANNED INTERVENTIONS: Therapeutic exercises, Therapeutic activity, Neuromuscular re-education, Balance training, Gait training, Patient/Family education, Self Care, Joint mobilization, Stair training, Electrical stimulation, Cryotherapy, Moist heat, Vasopneumatic device, Manual therapy, and Re-evaluation  PLAN FOR NEXT SESSION: nustep, lower extremity strengthening, knee AROM, manual therapy, and modalities as  needed   Darlin Coco, PT 12/26/2021, 1:14 PM

## 2022-01-09 ENCOUNTER — Ambulatory Visit: Payer: BC Managed Care – PPO

## 2022-01-09 DIAGNOSIS — M25562 Pain in left knee: Secondary | ICD-10-CM | POA: Diagnosis not present

## 2022-01-09 DIAGNOSIS — M25662 Stiffness of left knee, not elsewhere classified: Secondary | ICD-10-CM | POA: Diagnosis not present

## 2022-01-09 NOTE — Therapy (Signed)
OUTPATIENT PHYSICAL THERAPY LOWER EXTREMITY TREATMENT   Patient Name: Tamara Mendoza MRN: [id number] DOB:Jun 08, 1965, 56 y.o., female Today's Date: 01/09/2022   PT End of Session - 01/09/22 1040     Visit Number 9    Number of Visits 18    Date for PT Re-Evaluation 01/06/22    PT Start Time 1914   Patient arrived late to her appointment.   PT Stop Time 1125    PT Time Calculation (min) 46 min    Activity Tolerance Patient tolerated treatment well    Behavior During Therapy [facility] for tasks assessed/performed             Past Medical History:  Diagnosis Date   Anxiety and depression    Arthritis    Eczema    Mixed hyperlipidemia    Past Surgical History:  Procedure Laterality Date   BIOPSY  04/05/2020   Procedure: BIOPSY;  Surgeon: Eloise Harman, DO;  [facility];  Service: Endoscopy;;   BIOPSY  06/21/2020   Procedure: BIOPSY;  Surgeon: Irving Copas., MD;  Location: [facility];  Service: Gastroenterology;;   CESAREAN SECTION     COLONOSCOPY WITH PROPOFOL N/A 04/05/2020   Procedure: COLONOSCOPY WITH PROPOFOL;  Surgeon: Eloise Harman, DO;  [facility];  Service: Endoscopy;  Laterality: N/A;  11:00 ASA I/II, pt tested + 1/26   COLONOSCOPY WITH PROPOFOL N/A 06/21/2020   Procedure: COLONOSCOPY WITH PROPOFOL;  Surgeon: Rush Landmark Telford Nab., MD;  Location: [facility];  Service: Gastroenterology;  Laterality: N/A;   ENDOSCOPIC MUCOSAL RESECTION N/A 06/21/2020   Procedure: ENDOSCOPIC MUCOSAL RESECTION;  Surgeon: Rush Landmark Telford Nab., MD;  Location: [facility];  Service: Gastroenterology;  Laterality: N/A;   HEMOSTASIS CLIP PLACEMENT  06/21/2020   Procedure: HEMOSTASIS CLIP PLACEMENT;  Surgeon: Irving Copas., MD;  Location: [facility];  Service: Gastroenterology;;   POLYPECTOMY  04/05/2020   Procedure: POLYPECTOMY;  Surgeon: Eloise Harman, DO;  [facility];  Service: Endoscopy;;   POLYPECTOMY  06/21/2020    Procedure: POLYPECTOMY;  Surgeon: Irving Copas., MD;  Location: [facility];  Service: Gastroenterology;;   Patient Active Problem List   Diagnosis Date Noted   Bilateral primary osteoarthritis of knee 11/04/2020   Obesity (BMI 30.0-34.9) 09/09/2020   Arthritis 04/01/2020   Mixed hyperlipidemia    Difficulty sleeping 11/16/2019   Hot flashes 11/16/2019   Eczema of both hands 11/16/2019   Tobacco use 11/13/2019   Anxiety and depression 10/02/2019   REFERRING PROVIDER: Tillman Abide, PA-C  REFERRING DIAG: left knee replacement  THERAPY DIAG:  Stiffness of left knee, not elsewhere classified  Acute pain of left knee  Rationale for Evaluation and Treatment Rehabilitation  ONSET DATE: 11/16/21  SUBJECTIVE:   SUBJECTIVE STATEMENT: Patient reports that her knee feels good and is not hurting today.   PERTINENT HISTORY: Will need R knee replacement, OA, anxiety, and depression  PAIN:  Are you having pain? No.  PRECAUTIONS: Ultrasound.  PATIENT GOALS reduced pain, walk normally, and return to her normal work and daily activities  OBJECTIVE:   AROM  Left 11/9  Hip flexion    Hip extension    Hip abduction    Hip adduction    Hip internal rotation    Hip external rotation    Knee flexion  128  Knee extension    Ankle dorsiflexion    Ankle plantarflexion    Ankle inversion    Ankle eversion     (  Blank rows = not tested)  TODAY'S TREATMENT:                                   11/27 EXERCISE LOG  Exercise Repetitions and Resistance Comments  Nustep  L4 x 15 minutes; seat 5-4   Step up  6" step x 3 minutes   Cybex leg press 1 plate; seat 6; 3 minutes   Cybex knee flexion 50# x 2 minutes    Gastroc stretch  3 x 30 seconds    Blank cell = exercise not performed today  Modalities  Date:  Vaso: Knee, 34 degrees; low pressure, 10 mins, Pain                                   11/13 EXERCISE LOG  Exercise Repetitions and Resistance Comments  Nustep   L4 x 15 minutes; seat 7-5   Cybex knee extension 10# x 2 minutes   Cybex knee flexion 30# x 3 minutes   Standing HS stretch  3 x 30 seconds    Step up  6" step x 15 reps each    Rocker board  4.5 minutes         Blank cell = exercise not performed today   Modalities  Date:  Vaso: Knee, 34 degrees; low pressure, 10 mins, Pain   ASSESSMENT:  CLINICAL IMPRESSION: Patient was progressed with a weighted leg press in addition to familiar interventions with moderate difficulty. She required minimal cueing with today's interventions for proper exercise performance and pacing. She reported no increase in pain or discomfort with any of today's interventions. She reported that her knee felt alright upon the conclusion of treatment. She continues to require skilled physical therapy to address her remaining impairments to return to her prior level of function.   GOALS: Goals reviewed with patient? No  SHORT TERM GOALS: Target date: 12/15/2021  Patient will be independent with her initial HEP.  Baseline: Goal status: MET  2.  Patient will be able to demonstrate at least 95 degrees of active left knee flexion for improved mobility.  Baseline:  Goal status: MET  3.  Patient will be able to demonstrate active left knee extension within 5 degrees of neutral for improved gait mechanics.  Baseline:  Goal status: MET  4.  Patient will be able to complete her daily activities without her familiar pain exceeding 3/10.  Baseline:  Goal status: MET  LONG TERM GOALS: Target date: 01/05/2022   Patient will be independent with her advanced HEP.  Baseline:  Goal status: On-going  2.  Patient will be able to demonstrate at least 120 degrees of active left knee flexion for improved mobility.  Baseline:  Goal status: On-going  3.  Patient will be able to safely ambulate at least 80 feet without an assistive device for improved functional mobility.  Baseline:  Goal status: MET  4.  Patient will  be able to ambulate with no significant gait deviations.  Baseline:  Goal status: On-going   PLAN: PT FREQUENCY: 3x/week  PT DURATION: 6 weeks  PLANNED INTERVENTIONS: Therapeutic exercises, Therapeutic activity, Neuromuscular re-education, Balance training, Gait training, Patient/Family education, Self Care, Joint mobilization, Stair training, Electrical stimulation, Cryotherapy, Moist heat, Vasopneumatic device, Manual therapy, and Re-evaluation  PLAN FOR NEXT SESSION: nustep, lower extremity strengthening, knee AROM, manual therapy, and modalities  as needed   Darlin Coco, PT 01/09/2022, 2:54 PM

## 2022-01-19 ENCOUNTER — Ambulatory Visit: Payer: BC Managed Care – PPO | Attending: Orthopaedic Surgery

## 2022-01-19 DIAGNOSIS — M25662 Stiffness of left knee, not elsewhere classified: Secondary | ICD-10-CM | POA: Diagnosis not present

## 2022-01-19 DIAGNOSIS — M25562 Pain in left knee: Secondary | ICD-10-CM | POA: Insufficient documentation

## 2022-01-19 NOTE — Therapy (Signed)
OUTPATIENT PHYSICAL THERAPY LOWER EXTREMITY TREATMENT   Patient Name: Tamara Mendoza MRN: [id number] DOB:[birth year], 56 y.o., female Today's Date: 01/19/2022   PT End of Session - 01/19/22 1045     Visit Number 10    Number of Visits 18    Date for PT Re-Evaluation 02/17/22    PT Start Time 1030    PT Stop Time 1118    PT Time Calculation (min) 48 min    Activity Tolerance Patient tolerated treatment well    Behavior During Therapy [facility] for tasks assessed/performed              Past Medical History:  Diagnosis Date   Anxiety and depression    Arthritis    Eczema    Mixed hyperlipidemia    Past Surgical History:  Procedure Laterality Date   BIOPSY  04/05/2020   Procedure: BIOPSY;  Surgeon: Eloise Harman, DO;  [facility];  Service: Endoscopy;;   BIOPSY  06/21/2020   Procedure: BIOPSY;  Surgeon: Irving Copas., MD;  Location: [facility];  Service: Gastroenterology;;   CESAREAN SECTION     COLONOSCOPY WITH PROPOFOL N/A 04/05/2020   Procedure: COLONOSCOPY WITH PROPOFOL;  Surgeon: Eloise Harman, DO;  [facility];  Service: Endoscopy;  Laterality: N/A;  11:00 ASA I/II, pt tested + 1/26   COLONOSCOPY WITH PROPOFOL N/A 06/21/2020   Procedure: COLONOSCOPY WITH PROPOFOL;  Surgeon: Rush Landmark Telford Nab., MD;  Location: [facility];  Service: Gastroenterology;  Laterality: N/A;   ENDOSCOPIC MUCOSAL RESECTION N/A 06/21/2020   Procedure: ENDOSCOPIC MUCOSAL RESECTION;  Surgeon: Rush Landmark Telford Nab., MD;  Location: [facility];  Service: Gastroenterology;  Laterality: N/A;   HEMOSTASIS CLIP PLACEMENT  06/21/2020   Procedure: HEMOSTASIS CLIP PLACEMENT;  Surgeon: Irving Copas., MD;  Location: [facility];  Service: Gastroenterology;;   POLYPECTOMY  04/05/2020   Procedure: POLYPECTOMY;  Surgeon: Eloise Harman, DO;  [facility];  Service: Endoscopy;;   POLYPECTOMY  06/21/2020   Procedure: POLYPECTOMY;  Surgeon: Irving Copas., MD;  Location: [facility];  Service: Gastroenterology;;   Patient Active Problem List   Diagnosis Date Noted   Bilateral primary osteoarthritis of knee 11/04/2020   Obesity (BMI 30.0-34.9) 09/09/2020   Arthritis 04/01/2020   Mixed hyperlipidemia    Difficulty sleeping 11/16/2019   Hot flashes 11/16/2019   Eczema of both hands 11/16/2019   Tobacco use 11/13/2019   Anxiety and depression 10/02/2019   REFERRING PROVIDER: Tillman Abide, PA-C  REFERRING DIAG: left knee replacement  THERAPY DIAG:  Stiffness of left knee, not elsewhere classified  Acute pain of left knee  Rationale for Evaluation and Treatment Rehabilitation  ONSET DATE: 11/16/21  SUBJECTIVE:   SUBJECTIVE STATEMENT: Patient reports that she feels pretty good and has not had any problems since her last appointment. She feels that she is 98-99% back to her prior level of function.   PERTINENT HISTORY: Will need R knee replacement, OA, anxiety, and depression  PAIN:  Are you having pain? No.  PRECAUTIONS: Ultrasound.  PATIENT GOALS reduced pain, walk normally, and return to her normal work and daily activities  OBJECTIVE:   AROM  Left 11/9  Hip flexion    Hip extension    Hip abduction    Hip adduction    Hip internal rotation    Hip external rotation    Knee flexion  128  Knee extension    Ankle dorsiflexion    Ankle plantarflexion  Ankle inversion    Ankle eversion     (Blank rows = not tested)  TODAY'S TREATMENT:                                   12/7 EXERCISE LOG  Exercise Repetitions and Resistance Comments  Recumbent bike L3 x 19 minutes; seat 3-2   Cybex knee extension  10# x 2 minutes   Cybex knee flexion 50# x 2 minutes   Marching on BOSU  3 minutes        Blank cell = exercise not performed today                                    11/27 EXERCISE LOG  Exercise Repetitions and Resistance Comments  Nustep  L4 x 15 minutes; seat 5-4   Step up  6" step x 3  minutes   Cybex leg press 1 plate; seat 6; 3 minutes   Cybex knee flexion 50# x 2 minutes    Gastroc stretch  3 x 30 seconds    Blank cell = exercise not performed today  Modalities  Date:  Vaso: Knee, 34 degrees; low pressure, 10 mins, Pain                                   11/13 EXERCISE LOG  Exercise Repetitions and Resistance Comments  Nustep  L4 x 15 minutes; seat 7-5   Cybex knee extension 10# x 2 minutes   Cybex knee flexion 30# x 3 minutes   Standing HS stretch  3 x 30 seconds    Step up  6" step x 15 reps each    Rocker board  4.5 minutes         Blank cell = exercise not performed today   Modalities  Date:  Vaso: Knee, 34 degrees; low pressure, 10 mins, Pain   ASSESSMENT:  CLINICAL IMPRESSION: Patient is making good progress with skilled physical therapy as evidenced by her subjective reports, objective measures, functional mobility, and progress toward her goals. She was able to meet all of her goals for skilled physical therapy as she was able to navigate stairs with a reciprocal pattern, kneel and return to standing from her right knee, and ambulate with no significant gait deviations. She was able to complete today's interventions with moderate difficulty and fatigue. Recommend that she be discharged following her next appointment with an updated HEP.   GOALS: Goals reviewed with patient? No  SHORT TERM GOALS: Target date: 12/15/2021  Patient will be independent with her initial HEP.  Baseline: Goal status: MET  2.  Patient will be able to demonstrate at least 95 degrees of active left knee flexion for improved mobility.  Baseline:  Goal status: MET  3.  Patient will be able to demonstrate active left knee extension within 5 degrees of neutral for improved gait mechanics.  Baseline:  Goal status: MET  4.  Patient will be able to complete her daily activities without her familiar pain exceeding 3/10.  Baseline:  Goal status: MET  LONG TERM GOALS:  Target date: 01/05/2022   Patient will be independent with her advanced HEP.  Baseline:  Goal status: MET  2.  Patient will be able to demonstrate at least 120 degrees  of active left knee flexion for improved mobility.  Baseline: 123 degrees  Goal status: MET  3.  Patient will be able to safely ambulate at least 80 feet without an assistive device for improved functional mobility.  Baseline:  Goal status: MET  4.  Patient will be able to ambulate with no significant gait deviations.  Baseline:  Goal status: MET  PLAN: PT FREQUENCY: 3x/week  PT DURATION: 6 weeks  PLANNED INTERVENTIONS: Therapeutic exercises, Therapeutic activity, Neuromuscular re-education, Balance training, Gait training, Patient/Family education, Self Care, Joint mobilization, Stair training, Electrical stimulation, Cryotherapy, Moist heat, Vasopneumatic device, Manual therapy, and Re-evaluation  PLAN FOR NEXT SESSION: d/c to HEP    Darlin Coco, PT 01/19/2022, 12:24 PM

## 2022-02-02 ENCOUNTER — Ambulatory Visit: Payer: BC Managed Care – PPO | Admitting: Physical Therapy

## 2022-02-02 ENCOUNTER — Encounter: Payer: Self-pay | Admitting: Physical Therapy

## 2022-02-02 DIAGNOSIS — M25662 Stiffness of left knee, not elsewhere classified: Secondary | ICD-10-CM

## 2022-02-02 DIAGNOSIS — M25562 Pain in left knee: Secondary | ICD-10-CM

## 2022-02-02 NOTE — Therapy (Addendum)
OUTPATIENT PHYSICAL THERAPY LOWER EXTREMITY TREATMENT   Patient Name: Tamara Mendoza MRN: [id number] DOB:October 26, 1965, 56 y.o., female Today's Date: 02/02/2022   PT End of Session - 02/02/22 1048     Visit Number 11    Number of Visits 18    Date for PT Re-Evaluation 02/17/22    PT Start Time 1034    PT Stop Time 1111    PT Time Calculation (min) 37 min    Activity Tolerance Patient tolerated treatment well    Behavior During Therapy [facility] for tasks assessed/performed            Past Medical History:  Diagnosis Date   Anxiety and depression    Arthritis    Eczema    Mixed hyperlipidemia    Past Surgical History:  Procedure Laterality Date   BIOPSY  04/05/2020   Procedure: BIOPSY;  Surgeon: Eloise Harman, DO;  Location: [facility];  Service: Endoscopy;;   BIOPSY  06/21/2020   Procedure: BIOPSY;  Surgeon: Irving Copas., MD;  Location: [facility];  Service: Gastroenterology;;   CESAREAN SECTION     COLONOSCOPY WITH PROPOFOL N/A 04/05/2020   Procedure: COLONOSCOPY WITH PROPOFOL;  Surgeon: Eloise Harman, DO;  Location: [facility];  Service: Endoscopy;  Laterality: N/A;  11:00 ASA I/II, pt tested + 1/26   COLONOSCOPY WITH PROPOFOL N/A 06/21/2020   Procedure: COLONOSCOPY WITH PROPOFOL;  Surgeon: Rush Landmark Telford Nab., MD;  Location: [facility];  Service: Gastroenterology;  Laterality: N/A;   ENDOSCOPIC MUCOSAL RESECTION N/A 06/21/2020   Procedure: ENDOSCOPIC MUCOSAL RESECTION;  Surgeon: Rush Landmark Telford Nab., MD;  Location: [facility];  Service: Gastroenterology;  Laterality: N/A;   HEMOSTASIS CLIP PLACEMENT  06/21/2020   Procedure: HEMOSTASIS CLIP PLACEMENT;  Surgeon: Irving Copas., MD;  Location: [facility];  Service: Gastroenterology;;   POLYPECTOMY  04/05/2020   Procedure: POLYPECTOMY;  Surgeon: Eloise Harman, DO;  Location: [facility];  Service: Endoscopy;;   POLYPECTOMY  06/21/2020   Procedure: POLYPECTOMY;  Surgeon: Irving Copas., MD;  Location: [facility];  Service: Gastroenterology;;   Patient Active Problem List   Diagnosis Date Noted   Bilateral primary osteoarthritis of knee 11/04/2020   Obesity (BMI 30.0-34.9) 09/09/2020   Arthritis 04/01/2020   Mixed hyperlipidemia    Difficulty sleeping 11/16/2019   Hot flashes 11/16/2019   Eczema of both hands 11/16/2019   Tobacco use 11/13/2019   Anxiety and depression 10/02/2019   REFERRING PROVIDER: Tillman Abide, PA-C  REFERRING DIAG: left knee replacement  THERAPY DIAG:  Stiffness of left knee, not elsewhere classified  Acute pain of left knee  Rationale for Evaluation and Treatment Rehabilitation  ONSET DATE: 11/16/21  SUBJECTIVE:   SUBJECTIVE STATEMENT: No new complaints. To DC today as she is to return to work 02/2022.  PERTINENT HISTORY: Will need R knee replacement, OA, anxiety, and depression  PAIN:  Are you having pain? No.  PRECAUTIONS: Ultrasound.  PATIENT GOALS reduced pain, walk normally, and return to her normal work and daily activities  OBJECTIVE:   AROM  Left 11/9  Hip flexion    Hip extension    Hip abduction    Hip adduction    Hip internal rotation    Hip external rotation    Knee flexion  128  Knee extension    Ankle dorsiflexion    Ankle plantarflexion    Ankle inversion    Ankle eversion     (Blank rows = not tested)  TODAY'S TREATMENT:                                   12/7 EXERCISE LOG  Exercise Repetitions and Resistance Comments  Recumbent bike L3 x 19 minutes; seat 4   Cybex knee extension  10# x30 reps   Cybex knee flexion 50# x30 reps   DLS on BOSU Min UE support x3 min   LLE SLS 30 sec tolerance    Blank cell = exercise not performed today   ASSESSMENT:  CLINICAL IMPRESSION:  Patient presented in clinic with no complaints of knee pain. Patient to DC as she has met all STG and LTG and is supposed to return to work. Patient states that more than anything therex or activity  bothers the non operative knee more than the TKR. Patient able to tolerate therex well but limited with SLS. Patient encouraged to ice, elevate as needed for soreness and the edema that may present when she returns to work. 10% limitation with DC FOTO.  PHYSICAL THERAPY DISCHARGE SUMMARY  Visits from Start of Care: 11  Current functional level related to goals / functional outcomes: Patient has met all of her goals for skilled physical therapy.    Remaining deficits: None   Education / Equipment: HEP   Patient agrees to discharge. Patient goals were met. Patient is being discharged due to meeting the stated rehab goals.  Jacqulynn Cadet, PT, DPT    GOALS: Goals reviewed with patient? No  SHORT TERM GOALS: Target date: 12/15/2021  Patient will be independent with her initial HEP.  Baseline: Goal status: MET  2.  Patient will be able to demonstrate at least 95 degrees of active left knee flexion for improved mobility.  Baseline:  Goal status: MET  3.  Patient will be able to demonstrate active left knee extension within 5 degrees of neutral for improved gait mechanics.  Baseline:  Goal status: MET  4.  Patient will be able to complete her daily activities without her familiar pain exceeding 3/10.  Baseline:  Goal status: MET  LONG TERM GOALS: Target date: 01/05/2022   Patient will be independent with her advanced HEP.  Baseline:  Goal status: MET  2.  Patient will be able to demonstrate at least 120 degrees of active left knee flexion for improved mobility.  Baseline: 123 degrees  Goal status: MET  3.  Patient will be able to safely ambulate at least 80 feet without an assistive device for improved functional mobility.  Baseline:  Goal status: MET  4.  Patient will be able to ambulate with no significant gait deviations.  Baseline:  Goal status: MET  PLAN: PT FREQUENCY: 3x/week  PT DURATION: 6 weeks  PLANNED INTERVENTIONS: Therapeutic exercises, Therapeutic  activity, Neuromuscular re-education, Balance training, Gait training, Patient/Family education, Self Care, Joint mobilization, Stair training, Electrical stimulation, Cryotherapy, Moist heat, Vasopneumatic device, Manual therapy, and Re-evaluation  PLAN FOR NEXT SESSION: [place], PTA 02/02/2022, 5:30 PM

## 2022-02-08 ENCOUNTER — Other Ambulatory Visit: Payer: Self-pay | Admitting: Nurse Practitioner

## 2022-02-08 ENCOUNTER — Ambulatory Visit: Payer: BC Managed Care – PPO | Admitting: Nurse Practitioner

## 2022-02-08 ENCOUNTER — Encounter: Payer: Self-pay | Admitting: Nurse Practitioner

## 2022-02-08 VITALS — BP 138/86 | HR 88 | Temp 98.7°F | Ht 62.0 in | Wt 200.0 lb

## 2022-02-08 DIAGNOSIS — Z1231 Encounter for screening mammogram for malignant neoplasm of breast: Secondary | ICD-10-CM

## 2022-02-08 DIAGNOSIS — R21 Rash and other nonspecific skin eruption: Secondary | ICD-10-CM | POA: Diagnosis not present

## 2022-02-08 MED ORDER — CLOTRIMAZOLE-BETAMETHASONE 1-0.05 % EX CREA: 1.0000 | TOPICAL_CREAM | Freq: Two times a day (BID) | CUTANEOUS | 0 refills | Status: DC

## 2022-02-08 NOTE — Progress Notes (Signed)
   Acute Office Visit  Subjective:     Patient ID: Tamara Mendoza, female    DOB: 1965/07/27, 56 y.o.   MRN: [id number]  Chief Complaint  Patient presents with   Rash    Rash This is a new problem. The problem has been gradually worsening since onset. Location: intergluteal cleft. The rash is characterized by dryness, itchiness and burning. She was exposed to nothing. Pertinent negatives include no congestion or cough. Past treatments include nothing.     Review of Systems  Constitutional: Negative.   HENT: Negative.  Negative for congestion.   Respiratory:  Negative for cough.   Cardiovascular: Negative.   Skin:  Positive for rash.  All other systems reviewed and are negative.       Objective:    BP 138/86   Pulse 88   Temp 98.7 F (37.1 C)   Ht '5\' 2"'$  (1.575 m)   Wt 200 lb (90.7 kg)   SpO2 96%   BMI 36.58 kg/m  BP Readings from Last 3 Encounters:  02/08/22 138/86  11/30/21 108/73  10/27/21 105/71   Wt Readings from Last 3 Encounters:  02/08/22 200 lb (90.7 kg)  11/30/21 196 lb (88.9 kg)  10/27/21 191 lb (86.6 kg)      Physical Exam Vitals and nursing note reviewed.  Constitutional:      Appearance: Normal appearance. She is obese.  HENT:     Right Ear: External ear normal.     Left Ear: External ear normal.     Nose: Nose normal.  Cardiovascular:     Rate and Rhythm: Normal rate and regular rhythm.     Pulses: Normal pulses.     Heart sounds: Normal heart sounds.  Pulmonary:     Effort: Pulmonary effort is normal.     Breath sounds: Normal breath sounds.  Abdominal:     General: Bowel sounds are normal.  Skin:    General: Skin is warm.     Findings: No erythema or rash.  Neurological:     Mental Status: She is alert and oriented to person, place, and time.  Psychiatric:        Behavior: Behavior normal.     No results found for any visits on 02/08/22.      Assessment & Plan:  Rash in intergluteal cleft presents as Tinea, education hand  out given to patient. Clotrimazole anti fungal medication started. Patient knows to follow up with worsening unresolved symptoms.   Problem List Items Addressed This Visit   None Visit Diagnoses     Rash    -  Primary   Relevant Medications   clotrimazole-betamethasone (LOTRISONE) cream       Meds ordered this encounter  Medications   clotrimazole-betamethasone (LOTRISONE) cream    Sig: Apply 1 Application topically 2 (two) times daily.    Dispense:  30 g    Refill:  0    Order Specific Question:   Supervising Provider    Answer:   Claretta Fraise [phone]    No follow-ups on file.  Ivy Lynn, NP

## 2022-04-23 IMAGING — MG MM DIGITAL SCREENING BILAT W/ TOMO AND CAD
8 series · 8 of 24 positions shown · non-contrast
Comparison: Previous exam(s).

ACR Breast Density Category a: The breast tissue is almost entirely
fatty.

CLINICAL DATA: Screening.

EXAM:
DIGITAL SCREENING BILATERAL MAMMOGRAM WITH TOMOSYNTHESIS AND CAD
TECHNIQUE: Bilateral screening digital craniocaudal and mediolateral oblique
mammograms were obtained. Bilateral screening digital breast
tomosynthesis was performed. The images were evaluated with
computer-aided detection.

[L MLO synth-2D]
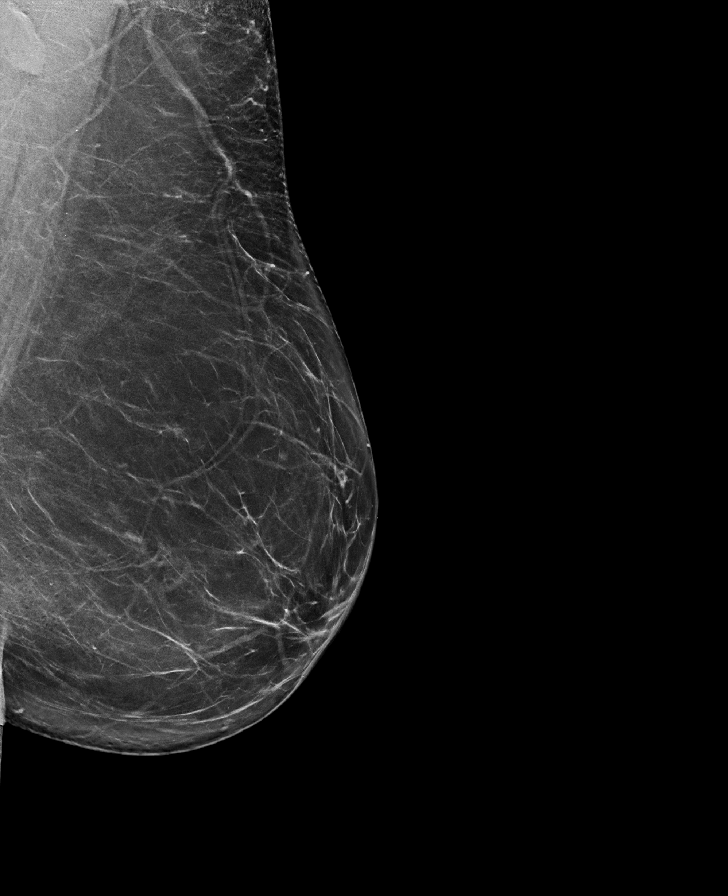

[R MLO synth-2D]
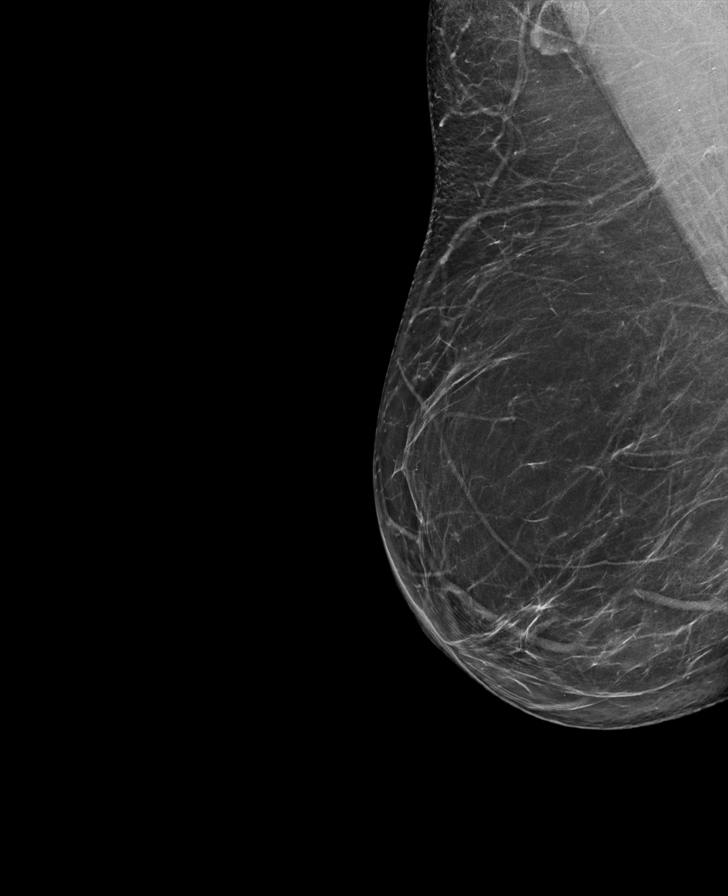

[L CC synth-2D]
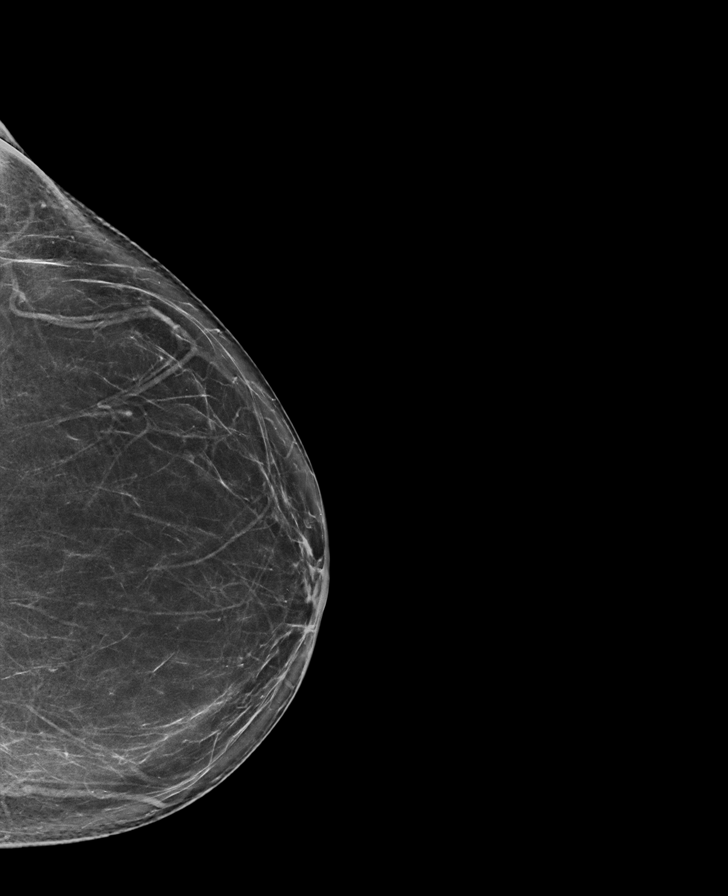

[R CC synth-2D]
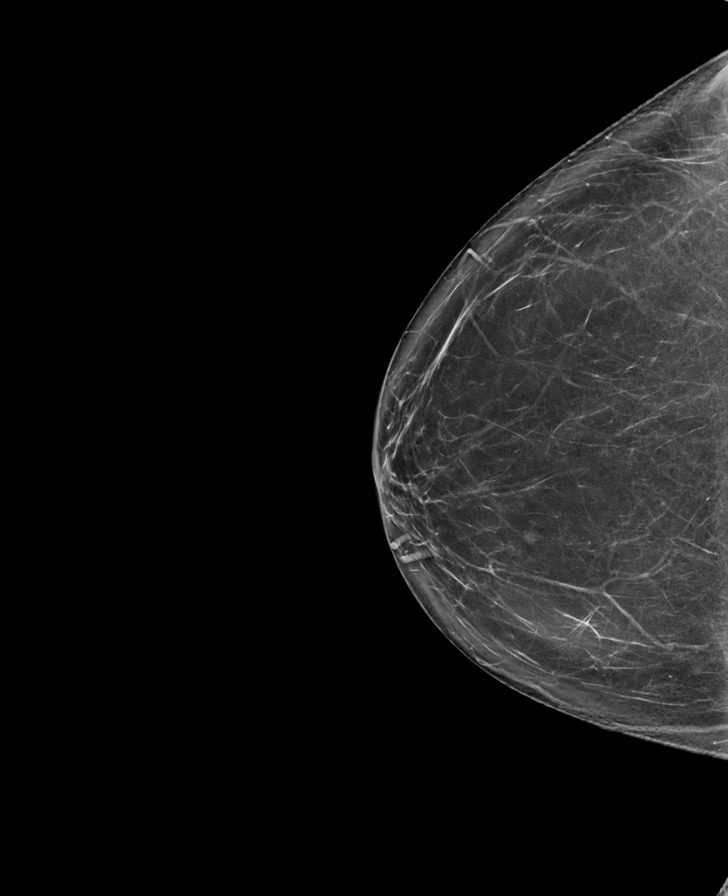

[R MLO tomo · tomo slice 41/81.0]
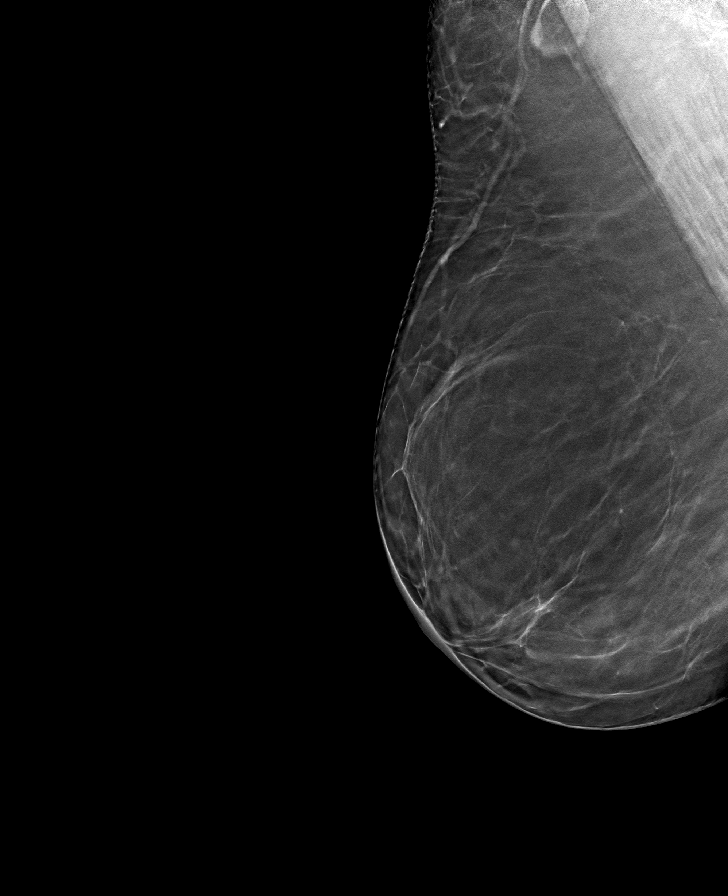

[L CC tomo · tomo slice 40/79.0]
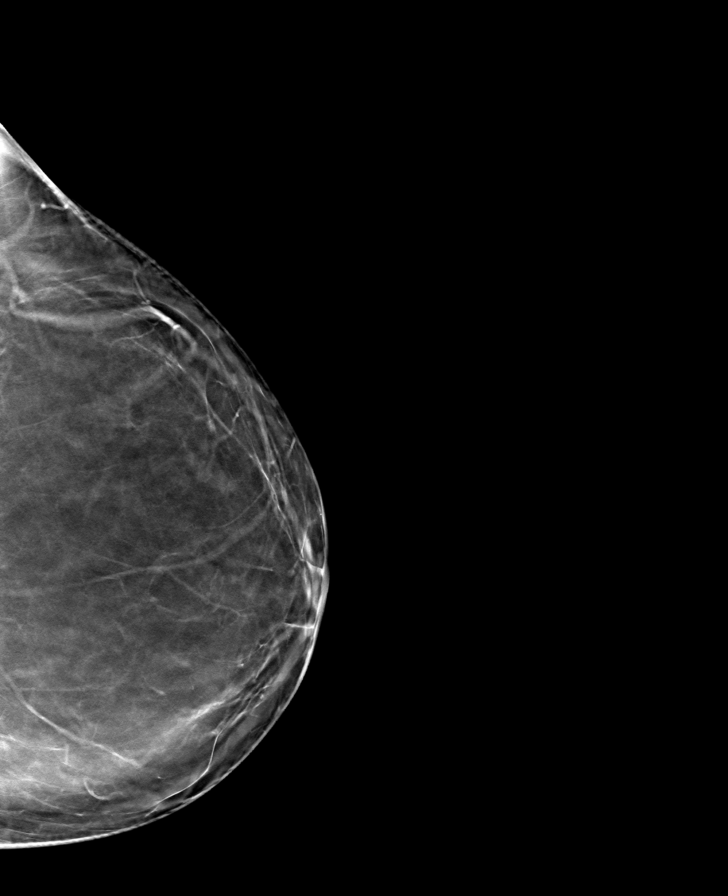

[L MLO tomo · tomo slice 43/85.0]
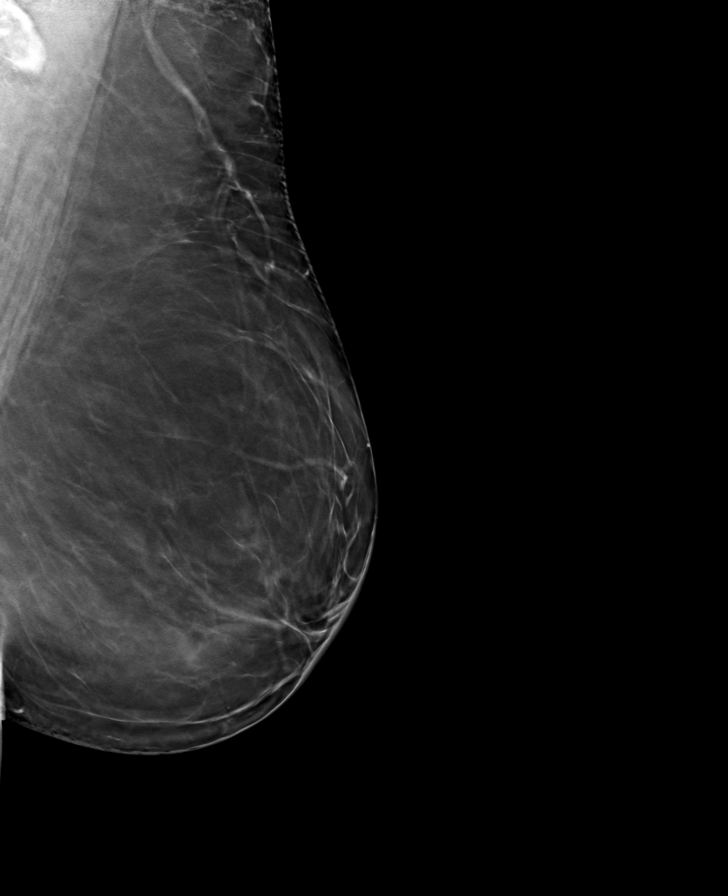

[R CC tomo · tomo slice 42/83.0]
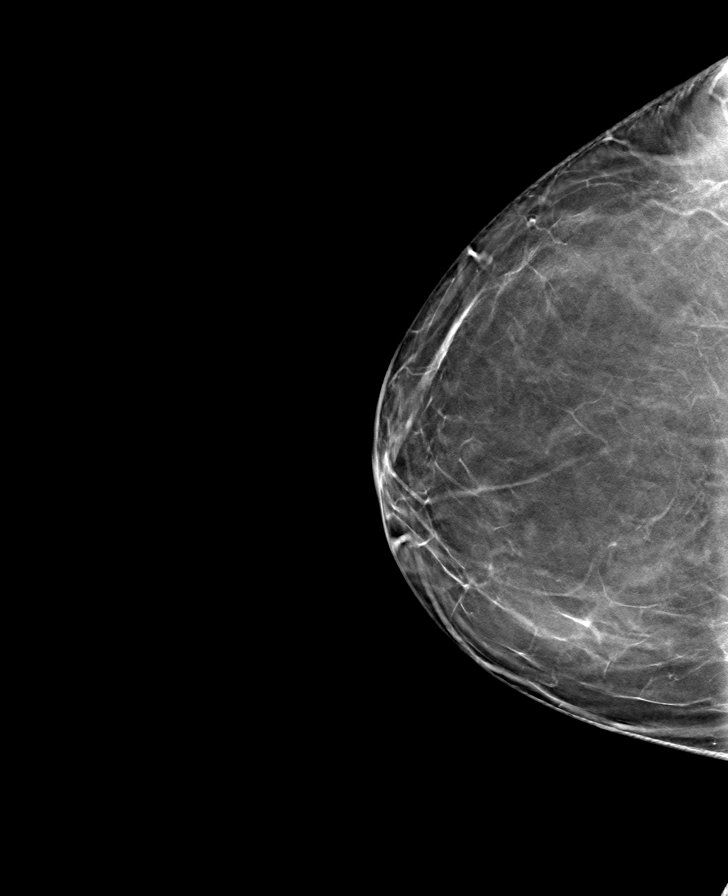

[8 of 24 positions shown; findings below may reference images not displayed]

FINDINGS: There are no findings suspicious for malignancy.
IMPRESSION: No mammographic evidence of malignancy. A result letter of this
screening mammogram will be mailed directly to the patient.

RECOMMENDATION:
Screening mammogram in one year. (Code:0E-3-N98)

BI-RADS CATEGORY  1: Negative.

## 2022-05-03 ENCOUNTER — Other Ambulatory Visit: Payer: Self-pay | Admitting: Family Medicine

## 2022-05-03 ENCOUNTER — Ambulatory Visit
Admission: RE | Admit: 2022-05-03 | Discharge: 2022-05-03 | Disposition: A | Discharge status: Home / Self Care | Payer: BC Managed Care – PPO | Source: Ambulatory Visit | Attending: Family Medicine | Admitting: Family Medicine

## 2022-05-03 DIAGNOSIS — Z1231 Encounter for screening mammogram for malignant neoplasm of breast: Secondary | ICD-10-CM | POA: Diagnosis not present

## 2022-07-13 ENCOUNTER — Encounter: Payer: BC Managed Care – PPO | Admitting: Family Medicine

## 2022-07-14 ENCOUNTER — Encounter: Payer: BC Managed Care – PPO | Admitting: Nurse Practitioner

## 2022-08-04 DIAGNOSIS — M542 Cervicalgia: Secondary | ICD-10-CM | POA: Diagnosis not present

## 2022-08-04 DIAGNOSIS — R079 Chest pain, unspecified: Secondary | ICD-10-CM | POA: Diagnosis not present

## 2022-08-04 DIAGNOSIS — R42 Dizziness and giddiness: Secondary | ICD-10-CM | POA: Diagnosis not present

## 2022-08-04 DIAGNOSIS — H81391 Other peripheral vertigo, right ear: Secondary | ICD-10-CM | POA: Diagnosis not present

## 2022-08-04 DIAGNOSIS — R1011 Right upper quadrant pain: Secondary | ICD-10-CM | POA: Diagnosis not present

## 2022-08-04 DIAGNOSIS — H81399 Other peripheral vertigo, unspecified ear: Secondary | ICD-10-CM | POA: Diagnosis not present

## 2022-11-09 ENCOUNTER — Encounter: Payer: BC Managed Care – PPO | Admitting: Family Medicine

## 2023-03-02 ENCOUNTER — Other Ambulatory Visit: Payer: BC Managed Care – PPO

## 2023-03-02 ENCOUNTER — Encounter: Payer: Self-pay | Admitting: Family Medicine

## 2023-03-02 ENCOUNTER — Ambulatory Visit (INDEPENDENT_AMBULATORY_CARE_PROVIDER_SITE_OTHER): Payer: BC Managed Care – PPO | Admitting: Family Medicine

## 2023-03-02 VITALS — BP 114/77 | HR 98 | Temp 97.3°F | Ht 62.0 in | Wt 194.2 lb

## 2023-03-02 DIAGNOSIS — F411 Generalized anxiety disorder: Secondary | ICD-10-CM

## 2023-03-02 DIAGNOSIS — R55 Syncope and collapse: Secondary | ICD-10-CM

## 2023-03-02 DIAGNOSIS — R5381 Other malaise: Secondary | ICD-10-CM | POA: Diagnosis not present

## 2023-03-02 DIAGNOSIS — R42 Dizziness and giddiness: Secondary | ICD-10-CM | POA: Diagnosis not present

## 2023-03-02 DIAGNOSIS — F339 Major depressive disorder, recurrent, unspecified: Secondary | ICD-10-CM

## 2023-03-02 DIAGNOSIS — E66811 Obesity, class 1: Secondary | ICD-10-CM

## 2023-03-02 DIAGNOSIS — R5383 Other fatigue: Secondary | ICD-10-CM

## 2023-03-02 DIAGNOSIS — R0602 Shortness of breath: Secondary | ICD-10-CM | POA: Diagnosis not present

## 2023-03-02 DIAGNOSIS — R000 Tachycardia, unspecified: Secondary | ICD-10-CM

## 2023-03-02 DIAGNOSIS — R631 Polydipsia: Secondary | ICD-10-CM

## 2023-03-02 DIAGNOSIS — M25511 Pain in right shoulder: Secondary | ICD-10-CM | POA: Diagnosis not present

## 2023-03-02 LAB — LIPID PANEL

## 2023-03-02 LAB — BAYER DCA HB A1C WAIVED: HB A1C (BAYER DCA - WAIVED): 5.2 % (ref 4.8–5.6)

## 2023-03-02 MED ORDER — ESCITALOPRAM OXALATE 10 MG PO TABS: 10.0000 mg | ORAL_TABLET | Freq: Every day | ORAL | 1 refills | Status: DC

## 2023-03-02 MED ORDER — MECLIZINE HCL 25 MG PO TABS: 25.0000 mg | ORAL_TABLET | Freq: Three times a day (TID) | ORAL | 0 refills | Status: DC | PRN

## 2023-03-02 NOTE — Patient Instructions (Signed)
If your symptoms worsen or you have thoughts of suicide/homicide, PLEASE SEEK IMMEDIATE MEDICAL ATTENTION.  You may always call the National Suicide Hotline.  This is available 24 hours a day, 7 days a week.  Their number is: 1-800-273-8255 ° °Taking the medicine as directed and not missing any doses is one of the best things you can do to treat your depression.  Here are some things to keep in mind: ° °Side effects (stomach upset, some increased anxiety) may happen before you notice a benefit.  These side effects typically go away over time. °Changes to your dose of medicine or a change in medication all together is sometimes necessary °Most people need to be on medication at least 12 months °Many people will notice an improvement within two weeks but the full effect of the medication can take up to 4-6 weeks °Stopping the medication when you start feeling better often results in a return of symptoms °Never discontinue your medication without contacting a health care professional first.  Some medications require gradual discontinuation/ taper and can make you sick if you stop them abruptly. ° °If your symptoms worsen or you have thoughts of suicide/homicide, PLEASE SEEK IMMEDIATE MEDICAL ATTENTION.  You may always call: ° °National Suicide Hotline: 800-273-8255 °[place] Crisis Line: 336-832-9700 °[facility] in [place]: 800-939-5911 ° °These are available 24 hours a day, 7 days a week. ° °

## 2023-03-02 NOTE — Progress Notes (Signed)
Subjective:  Patient ID: Tamara Mendoza, female    DOB: 12/18/1965, 58 y.o.   MRN: [id number]  Patient Care Team: Sonny Masters, FNP as PCP - General (Family Medicine) Lanelle Bal, DO as Consulting Physician (Internal Medicine) Danella Maiers, [facility] as Pharmacist (Family Medicine)   Chief Complaint:  Establish Care (Previous JE patient ), Dizziness (X 1 week daily ), and Anxiety (Patient has been off medication and thinks she needs to go back on it. )   HPI: Tamara Mendoza is a 58 y.o. female presenting on 03/02/2023 for Establish Care (Previous JE patient ), Dizziness (X 1 week daily ), and Anxiety (Patient has been off medication and thinks she needs to go back on it. )   Discussed the use of AI scribe software for clinical note transcription with the patient, who gave verbal consent to proceed.  History of Present Illness   The patient, last seen in December 2023, comes in today to establish care with new PCP. She presents with a week-long history of recurrent dizziness, described as severe and debilitating. The dizziness, previously diagnosed as vertigo in the emergency department, is exacerbated by positional changes, particularly bending down. The patient reports that the dizziness does not completely resolve with lying down, and is notably worse when lying on the left side. Associated symptoms include nausea and vomiting. The patient has been self-medicating with leftover Meclizine from a previous episode, with partial relief.  The patient also reports intermittent right shoulder pain, originating in the back and radiating to the front. The pain, lasting only a few minutes, started concurrently with the recent onset of dizziness. The patient denies any identifiable triggers for the pain.  The patient has been experiencing increased thirst and has a persistent cough that occasionally leads to shortness of breath. The patient denies any exertional dyspnea. The patient also reports  chronic fatigue, which she attributes to age and stress.  The patient has a history of depression and anxiety, previously managed with Lexapro, which was discontinued a month prior to the current visit. The patient expresses a desire to restart this medication due to ongoing symptoms. The patient denies any suicidal ideation.  The patient reports no significant changes in weight but acknowledges a need for weight loss. The patient denies any near syncope episodes but describes a sensation of wooziness. The patient has been avoiding driving due to the unpredictable onset of dizziness.          Relevant past medical, surgical, family, and social history reviewed and updated as indicated.  Allergies and medications reviewed and updated. Data reviewed: Chart in Epic.   Past Medical History:  Diagnosis Date   Anxiety and depression    Arthritis    Eczema    Mixed hyperlipidemia     Past Surgical History:  Procedure Laterality Date   BIOPSY  04/05/2020   Procedure: BIOPSY;  Surgeon: Lanelle Bal, DO;  Location: [facility];  Service: Endoscopy;;   BIOPSY  06/21/2020   Procedure: BIOPSY;  Surgeon: Lemar Lofty., MD;  Location: [facility];  Service: Gastroenterology;;   CESAREAN SECTION     COLONOSCOPY WITH PROPOFOL N/A 04/05/2020   Procedure: COLONOSCOPY WITH PROPOFOL;  Surgeon: Lanelle Bal, DO;  Location: [facility];  Service: Endoscopy;  Laterality: N/A;  11:00 ASA I/II, pt tested + 1/26   COLONOSCOPY WITH PROPOFOL N/A 06/21/2020   Procedure: COLONOSCOPY WITH PROPOFOL;  Surgeon: Meridee Score Netty Starring., MD;  Location:  [facility];  Service: Gastroenterology;  Laterality: N/A;   ENDOSCOPIC MUCOSAL RESECTION N/A 06/21/2020   Procedure: ENDOSCOPIC MUCOSAL RESECTION;  Surgeon: Meridee Score Netty Starring., MD;  Location: [facility];  Service: Gastroenterology;  Laterality: N/A;   HEMOSTASIS CLIP PLACEMENT  06/21/2020   Procedure: HEMOSTASIS CLIP PLACEMENT;  Surgeon:  Lemar Lofty., MD;  Location: [facility];  Service: Gastroenterology;;   POLYPECTOMY  04/05/2020   Procedure: POLYPECTOMY;  Surgeon: Lanelle Bal, DO;  Location: [facility];  Service: Endoscopy;;   POLYPECTOMY  06/21/2020   Procedure: POLYPECTOMY;  Surgeon: Lemar Lofty., MD;  Location: [facility];  Service: Gastroenterology;;    Social History   Socioeconomic History   Marital status: Single    Spouse name: Not on file   Number of children: Not on file   Years of education: Not on file   Highest education level: Not on file  Occupational History   Occupation: [employer]  Tobacco Use   Smoking status: Every Day    Current packs/day: 0.25    Average packs/day: 0.3 packs/day for 30.0 years (7.5 ttl pk-yrs)    Types: Cigarettes   Smokeless tobacco: Never  Vaping Use   Vaping status: Never Used  Substance and Sexual Activity   Alcohol use: Yes    Comment: occ   Drug use: No   Sexual activity: Not on file  Other Topics Concern   Not on file  Social History Narrative   Not on file   Social Drivers of Health   Financial Resource Strain: Not on file  Food Insecurity: Not on file  Transportation Needs: Not on file  Physical Activity: Not on file  Stress: Not on file  Social Connections: Not on file  Intimate Partner Violence: Not At Risk (11/16/2021)   Received from [facility], [facility]   Interpersonal Violence    Patient afraid of, threatened, hurt, or sexually abused by someone known to him/her: No    Outpatient Encounter Medications as of 03/02/2023  Medication Sig   meclizine (ANTIVERT) 25 MG tablet Take 1 tablet (25 mg total) by mouth 3 (three) times daily as needed for dizziness.   [DISCONTINUED] escitalopram (LEXAPRO) 10 MG tablet Take 1 tablet (10 mg total) by mouth daily.   [DISCONTINUED] meclizine (ANTIVERT) 12.5 MG tablet Take 12.5 mg by mouth 3 (three) times daily as needed for dizziness.   escitalopram  (LEXAPRO) 10 MG tablet Take 1 tablet (10 mg total) by mouth daily.   [DISCONTINUED] clotrimazole-betamethasone (LOTRISONE) cream Apply 1 Application topically 2 (two) times daily.   [DISCONTINUED] promethazine (PHENERGAN) 25 MG tablet Take 25 mg by mouth every 6 (six) hours as needed for nausea or vomiting. (Patient not taking: Reported on 03/02/2023)   No facility-administered encounter medications on file as of 03/02/2023.    No Known Allergies  Pertinent ROS per HPI, otherwise unremarkable      Objective:  BP 114/77   Pulse 98   Temp (!) 97.3 F (36.3 C)   Ht 5\' 2"  (1.575 m)   Wt 194 lb 3.2 oz (88.1 kg)   SpO2 99%   BMI 35.52 kg/m    Wt Readings from Last 3 Encounters:  03/02/23 194 lb 3.2 oz (88.1 kg)  02/08/22 200 lb (90.7 kg)  11/30/21 196 lb (88.9 kg)    Physical Exam Vitals and nursing note reviewed.  Constitutional:      General: She is not in acute distress.    Appearance: Normal appearance. She is  obese. She is not ill-appearing, toxic-appearing or diaphoretic.  HENT:     Head: Normocephalic and atraumatic.     Right Ear: Ear canal and external ear normal. A middle ear effusion is present. Tympanic membrane is not erythematous.     Left Ear: Ear canal and external ear normal. A middle ear effusion is present. Tympanic membrane is not erythematous.     Mouth/Throat:     Mouth: Mucous membranes are moist.     Pharynx: Oropharynx is clear.  Eyes:     General: Lids are normal.     Extraocular Movements:     Right eye: Nystagmus present.     Left eye: Nystagmus present.     Conjunctiva/sclera: Conjunctivae normal.     Pupils: Pupils are equal, round, and reactive to light.  Cardiovascular:     Rate and Rhythm: Normal rate and regular rhythm.     Heart sounds: Normal heart sounds.  Pulmonary:     Effort: Pulmonary effort is normal.     Breath sounds: Normal breath sounds.  Musculoskeletal:        General: Normal range of motion.     Cervical back: Normal  range of motion and neck supple.     Right lower leg: No edema.     Left lower leg: No edema.  Lymphadenopathy:     Cervical: No cervical adenopathy.  Skin:    General: Skin is warm and dry.     Capillary Refill: Capillary refill takes less than 2 seconds.  Neurological:     General: No focal deficit present.     Mental Status: She is alert and oriented to person, place, and time.     Cranial Nerves: No cranial nerve deficit.     Sensory: No sensory deficit.     Motor: No weakness.     Coordination: Coordination normal.     Gait: Gait normal.     Deep Tendon Reflexes: Reflexes normal.  Psychiatric:        Mood and Affect: Mood normal.        Behavior: Behavior normal.        Thought Content: Thought content normal.        Judgment: Judgment normal.    Physical Exam   NEUROLOGICAL: Horizontal nystagmus observed during eye movement to extreme lateral positions.         03/02/2023    2:16 PM 02/08/2022   10:41 AM 11/30/2021   11:05 AM 10/27/2021    2:42 PM 10/19/2021   10:44 AM  Depression screen PHQ 2/9  Decreased Interest 2 1 1 2 3   Down, Depressed, Hopeless 2 1 2 2 3   PHQ - 2 Score 4 2 3 4 6   Altered sleeping 3 1 1 3 3   Tired, decreased energy 3 1 1 3 3   Change in appetite 2 0 2 2 3   Feeling bad or failure about yourself  1 0 1 1 1   Trouble concentrating 2 0 1 2 1   Moving slowly or fidgety/restless 0 0 1 1 1   Suicidal thoughts 0 0 0 0 0  PHQ-9 Score 15 4 10 16 18   Difficult doing work/chores Not difficult at all Somewhat difficult Not difficult at all Not difficult at all Somewhat difficult      03/02/2023    2:16 PM 02/08/2022   10:42 AM 11/30/2021   11:04 AM 10/27/2021    2:42 PM  GAD 7 : Generalized Anxiety Score  Nervous, Anxious, on Edge 3  1 2 2   Control/stop worrying 2 2 2 2   Worry too much - different things 2 1 2 2   Trouble relaxing 2 1 2 2   Restless 1 2 2 2   Easily annoyed or irritable 3 1 1 2   Afraid - awful might happen 1 1 1 1   Total GAD 7 Score 14  9 12 13   Anxiety Difficulty Not difficult at all Somewhat difficult Somewhat difficult Somewhat difficult      Results for orders placed or performed in visit on 10/19/21  Bayer DCA Hb A1c Waived   Collection Time: 10/19/21 11:19 AM  Result Value Ref Range   HB A1C (BAYER DCA - WAIVED) 5.1 4.8 - 5.6 %  CBC with Differential/Platelet   Collection Time: 10/19/21 11:21 AM  Result Value Ref Range   WBC 5.3 3.4 - 10.8 x10E3/uL   RBC 5.11 3.77 - 5.28 x10E6/uL   Hemoglobin 14.1 11.1 - 15.9 g/dL   Hematocrit 16.1 09.6 - 46.6 %   MCV 85 79 - 97 fL   MCH 27.6 26.6 - 33.0 pg   MCHC 32.3 31.5 - 35.7 g/dL   RDW 04.5 40.9 - 81.1 %   Platelets 281 150 - 450 x10E3/uL   Neutrophils 57 Not Estab. %   Lymphs 31 Not Estab. %   Monocytes 10 Not Estab. %   Eos 1 Not Estab. %   Basos 1 Not Estab. %   Neutrophils Absolute 3.0 1.4 - 7.0 x10E3/uL   Lymphocytes Absolute 1.6 0.7 - 3.1 x10E3/uL   Monocytes Absolute 0.5 0.1 - 0.9 x10E3/uL   EOS (ABSOLUTE) 0.1 0.0 - 0.4 x10E3/uL   Basophils Absolute 0.1 0.0 - 0.2 x10E3/uL   Immature Granulocytes 0 Not Estab. %   Immature Grans (Abs) 0.0 0.0 - 0.1 x10E3/uL  CMP14+EGFR   Collection Time: 10/19/21 11:21 AM  Result Value Ref Range   Glucose 81 70 - 99 mg/dL   BUN 11 6 - 24 mg/dL   Creatinine, Ser 9.14 (L) 0.57 - 1.00 mg/dL   eGFR 782 >95 AO/ZHY/8.65   BUN/Creatinine Ratio 21 9 - 23   Sodium 139 134 - 144 mmol/L   Potassium 4.2 3.5 - 5.2 mmol/L   Chloride 102 96 - 106 mmol/L   CO2 23 20 - 29 mmol/L   Calcium 9.4 8.7 - 10.2 mg/dL   Total Protein 6.7 6.0 - 8.5 g/dL   Albumin 4.3 3.8 - 4.9 g/dL   Globulin, Total 2.4 1.5 - 4.5 g/dL   Albumin/Globulin Ratio 1.8 1.2 - 2.2   Bilirubin Total 0.5 0.0 - 1.2 mg/dL   Alkaline Phosphatase 81 44 - 121 IU/L   AST 15 0 - 40 IU/L   ALT 12 0 - 32 IU/L  Lipid panel   Collection Time: 10/19/21 11:21 AM  Result Value Ref Range   Cholesterol, Total 225 (H) 100 - 199 mg/dL   Triglycerides 784 (H) 0 - 149 mg/dL    HDL 43 >69 mg/dL   VLDL Cholesterol Cal 30 5 - 40 mg/dL   LDL Chol Calc (NIH) 629 (H) 0 - 99 mg/dL   Chol/HDL Ratio 5.2 (H) 0.0 - 4.4 ratio     EKG: SR 84, PR 140 ms, QT 328 ms, no acute ST-T changes, changes noted in V2 when compared to prior. Kari Baars, FNP-C  Pertinent labs & imaging results that were available during my care of the patient were reviewed by me and considered in my medical decision making.  Assessment &  Plan:  Tamara "Tamara Mendoza" was seen today for establish care, dizziness and anxiety.  Diagnoses and all orders for this visit:  Dizziness -     Ambulatory referral to Physical Therapy -     Anemia Profile B -     CMP14+EGFR -     Thyroid Panel With TSH -     EKG 12-Lead -     LONG TERM MONITOR (3-14 DAYS); Future -     Bayer DCA Hb A1c Waived -     meclizine (ANTIVERT) 25 MG tablet; Take 1 tablet (25 mg total) by mouth 3 (three) times daily as needed for dizziness. -     Orthostatic vital signs  Increased thirst -     Anemia Profile B -     CMP14+EGFR -     Thyroid Panel With TSH -     Bayer DCA Hb A1c Waived  Acute pain of right shoulder -     EKG 12-Lead  Shortness of breath -     Anemia Profile B -     CMP14+EGFR -     Lipid panel -     Thyroid Panel With TSH -     EKG 12-Lead -     LONG TERM MONITOR (3-14 DAYS); Future  Malaise and fatigue -     Anemia Profile B -     CMP14+EGFR -     Lipid panel -     Thyroid Panel With TSH -     EKG 12-Lead -     LONG TERM MONITOR (3-14 DAYS); Future -     Bayer DCA Hb A1c Waived  Postural dizziness with near syncope -     Ambulatory referral to Physical Therapy -     Anemia Profile B -     CMP14+EGFR -     Lipid panel -     Thyroid Panel With TSH -     EKG 12-Lead -     LONG TERM MONITOR (3-14 DAYS); Future -     meclizine (ANTIVERT) 25 MG tablet; Take 1 tablet (25 mg total) by mouth 3 (three) times daily as needed for dizziness. -     Orthostatic vital signs  Obesity (BMI 30.0-34.9) -     Anemia  Profile B -     CMP14+EGFR -     Lipid panel -     Thyroid Panel With TSH -     Bayer DCA Hb A1c Waived  GAD (generalized anxiety disorder) -     Anemia Profile B -     CMP14+EGFR -     Thyroid Panel With TSH -     escitalopram (LEXAPRO) 10 MG tablet; Take 1 tablet (10 mg total) by mouth daily.  Recurrent depression (HCC) -     Anemia Profile B -     CMP14+EGFR -     Thyroid Panel With TSH -     escitalopram (LEXAPRO) 10 MG tablet; Take 1 tablet (10 mg total) by mouth daily.     Assessment and Plan    Vertigo Intermittent dizziness for one week, exacerbated by positional changes, particularly when bending over or lying on the left side. Previous diagnosis of vertigo with partial relief from meclizine. Symptoms include nausea, occasional vomiting, and increased thirst. Differential diagnosis includes inner ear issues, electrolyte imbalances, anemia, thyroid dysfunction, and arrhythmias. Horizontal nystagmus noted on exam, suggesting vertigo. Discussed potential causes including inner ear problems, sodium imbalances, and the presence of crystals in the  eustachian tubes. Explained that vestibular rehab can help reestablish balance by moving these crystals. Advised caution with meclizine due to sedation risk and recommended no driving or operating heavy machinery while taking it. - Order blood work to check for anemia, electrolyte imbalances, and thyroid dysfunction - Order EKG and heart monitor to check for arrhythmias - Refer to physical therapy for vestibular rehab - Prescribe meclizine 25 mg, cautioning against sedation and advising no driving or operating heavy machinery while taking it - Advise taking a few days off work until symptoms improve  Depression and Anxiety Depression and anxiety exacerbated after stopping Lexapro one month ago. No suicidal ideation reported. Discussed the importance of restarting Lexapro to manage symptoms. No significant side effects reported from  previous use. - Restart Lexapro - Follow-up in 4-6 weeks to assess response to treatment  General Health Maintenance Discussed lifestyle modifications for weight management and hydration. No significant weight changes reported. - Discuss lifestyle modifications for weight management and hydration  Follow-up - Follow-up in 4-6 weeks to review blood work, EKG, and response to vestibular rehab and Lexapro - Contact patient sooner if significant abnormalities are found in labs or heart monitor.      Total time spent with patient 45 minutes.  Greater than 50% of encounter spent in coordination of care/counseling.     Continue all other maintenance medications.  Follow up plan: Return in 4 weeks (on 03/30/2023), or if symptoms worsen or fail to improve, for vertigo.   Continue healthy lifestyle choices, including diet (rich in fruits, vegetables, and lean proteins, and low in salt and simple carbohydrates) and exercise (at least 30 minutes of moderate physical activity daily).  Educational handout given for depression   The above assessment and management plan was discussed with the patient. The patient verbalized understanding of and has agreed to the management plan. Patient is aware to call the clinic if they develop any new symptoms or if symptoms persist or worsen. Patient is aware when to return to the clinic for a follow-up visit. Patient educated on when it is appropriate to go to the emergency department.   Kari Baars, FNP-C [facility] [phone]

## 2023-03-05 LAB — ANEMIA PROFILE B
Basophils Absolute: 0.1 10*3/uL (ref 0.0–0.2)
Basos: 1 %
EOS (ABSOLUTE): 0.1 10*3/uL (ref 0.0–0.4)
Eos: 2 %
Ferritin: 155 ng/mL — ABNORMAL HIGH (ref 15–150)
Folate: 5.8 ng/mL (ref >3.0)
Hematocrit: 43.6 % (ref 34.0–46.6)
Hemoglobin: 14.1 g/dL (ref 11.1–15.9)
Immature Grans (Abs): 0 10*3/uL (ref 0.0–0.1)
Immature Granulocytes: 1 %
Iron Saturation: 21 % (ref 15–55)
Iron: 55 ug/dL (ref 27–159)
Lymphocytes Absolute: 2.1 10*3/uL (ref 0.7–3.1)
Lymphs: 28 %
MCH: 27.8 pg (ref 26.6–33.0)
MCHC: 32.3 g/dL (ref 31.5–35.7)
MCV: 86 fL (ref 79–97)
Monocytes Absolute: 0.8 10*3/uL (ref 0.1–0.9)
Monocytes: 11 %
Neutrophils Absolute: 4.2 10*3/uL (ref 1.4–7.0)
Neutrophils: 57 %
Platelets: 287 10*3/uL (ref 150–450)
RBC: 5.07 x10E6/uL (ref 3.77–5.28)
RDW: 12.7 % (ref 11.7–15.4)
Retic Ct Pct: 1.2 % (ref 0.6–2.6)
Total Iron Binding Capacity: 263 ug/dL (ref 250–450)
UIBC: 208 ug/dL (ref 131–425)
Vitamin B-12: 274 pg/mL (ref 232–1245)
WBC: 7.3 10*3/uL (ref 3.4–10.8)

## 2023-03-05 LAB — CMP14+EGFR
ALT: 16 IU/L (ref 0–32)
AST: 15 IU/L (ref 0–40)
Albumin: 3.9 g/dL (ref 3.8–4.9)
Alkaline Phosphatase: 105 IU/L (ref 44–121)
BUN/Creatinine Ratio: 22 (ref 9–23)
BUN: 12 mg/dL (ref 6–24)
Bilirubin Total: 0.5 mg/dL (ref 0.0–1.2)
CO2: 24 mmol/L (ref 20–29)
Calcium: 9.1 mg/dL (ref 8.7–10.2)
Chloride: 100 mmol/L (ref 96–106)
Creatinine, Ser: 0.55 mg/dL — ABNORMAL LOW (ref 0.57–1.00)
Globulin, Total: 2.6 g/dL (ref 1.5–4.5)
Glucose: 90 mg/dL (ref 70–99)
Potassium: 4.2 mmol/L (ref 3.5–5.2)
Sodium: 137 mmol/L (ref 134–144)
Total Protein: 6.5 g/dL (ref 6.0–8.5)
eGFR: 106 mL/min/{1.73_m2} (ref >59)

## 2023-03-05 LAB — THYROID PANEL WITH TSH
Free Thyroxine Index: 2.2 (ref 1.2–4.9)
T3 Uptake Ratio: 24 % (ref 24–39)
T4, Total: 9.3 ug/dL (ref 4.5–12.0)
TSH: 1.58 u[IU]/mL (ref 0.450–4.500)

## 2023-03-05 LAB — LIPID PANEL
Cholesterol, Total: 232 mg/dL — ABNORMAL HIGH (ref 100–199)
HDL: 44 mg/dL (ref >39)
LDL CALC COMMENT:: 5.3 ratio — ABNORMAL HIGH (ref 0.0–4.4)
LDL Chol Calc (NIH): 158 mg/dL — ABNORMAL HIGH (ref 0–99)
Triglycerides: 163 mg/dL — ABNORMAL HIGH (ref 0–149)
VLDL Cholesterol Cal: 30 mg/dL (ref 5–40)

## 2023-03-06 ENCOUNTER — Encounter: Payer: Self-pay | Admitting: Family Medicine

## 2023-03-15 ENCOUNTER — Ambulatory Visit (HOSPITAL_COMMUNITY): Payer: BC Managed Care – PPO

## 2023-03-28 NOTE — Addendum Note (Signed)
Addended by: Sonny Masters on: 03/28/2023 11:25 AM   Modules accepted: Orders

## 2023-04-03 ENCOUNTER — Ambulatory Visit: Payer: BC Managed Care – PPO | Admitting: Family Medicine

## 2023-04-12 ENCOUNTER — Ambulatory Visit: Payer: BC Managed Care – PPO | Admitting: Family Medicine

## 2023-04-20 ENCOUNTER — Ambulatory Visit (INDEPENDENT_AMBULATORY_CARE_PROVIDER_SITE_OTHER): Payer: BC Managed Care – PPO | Admitting: Family Medicine

## 2023-04-20 ENCOUNTER — Encounter: Payer: Self-pay | Admitting: Family Medicine

## 2023-04-20 VITALS — BP 113/76 | HR 76 | Temp 97.0°F | Ht 62.0 in | Wt 191.6 lb

## 2023-04-20 DIAGNOSIS — E782 Mixed hyperlipidemia: Secondary | ICD-10-CM | POA: Diagnosis not present

## 2023-04-20 DIAGNOSIS — F411 Generalized anxiety disorder: Secondary | ICD-10-CM | POA: Diagnosis not present

## 2023-04-20 DIAGNOSIS — R42 Dizziness and giddiness: Secondary | ICD-10-CM | POA: Diagnosis not present

## 2023-04-20 DIAGNOSIS — R000 Tachycardia, unspecified: Secondary | ICD-10-CM

## 2023-04-20 DIAGNOSIS — F339 Major depressive disorder, recurrent, unspecified: Secondary | ICD-10-CM | POA: Insufficient documentation

## 2023-04-20 NOTE — Progress Notes (Signed)
 Subjective:  Patient ID: Tamara Mendoza, female    DOB: 1965/11/24, 58 y.o.   MRN: [id number]  Patient Care Team: Sonny Masters, FNP as PCP - General (Family Medicine) Lanelle Bal, DO as Consulting Physician (Internal Medicine) Danella Maiers, [facility] as Pharmacist (Family Medicine)   Chief Complaint:  Dizziness (4-6 week follow up - patient states it is better. )   HPI: Tamara Mendoza is a 58 y.o. female presenting on 04/20/2023 for Dizziness (4-6 week follow up - patient states it is better. )   Discussed the use of AI scribe software for clinical note transcription with the patient, who gave verbal consent to proceed.  History of Present Illness   The patient presents with vertigo and dizziness for follow-up.  She reports significant improvement in her vertigo and dizziness symptoms, with only one dizzy spell since her last visit. She uses meclizine as needed for dizziness, which has been effective, requiring only one dose since the last appointment. No associated nausea or vomiting is present.  She is on Lexapro daily for depression and anxiety and is doing well with no significant symptoms. She does not require a refill at this time.         04/20/2023   10:42 AM 03/02/2023    2:16 PM 02/08/2022   10:41 AM 11/30/2021   11:05 AM 10/27/2021    2:42 PM  Depression screen PHQ 2/9  Decreased Interest 2 2 1 1 2   Down, Depressed, Hopeless 2 2 1 2 2   PHQ - 2 Score 4 4 2 3 4   Altered sleeping 2 3 1 1 3   Tired, decreased energy 2 3 1 1 3   Change in appetite 2 2 0 2 2  Feeling bad or failure about yourself  2 1 0 1 1  Trouble concentrating 2 2 0 1 2  Moving slowly or fidgety/restless 2 0 0 1 1  Suicidal thoughts 0 0 0 0 0  PHQ-9 Score 16 15 4 10 16   Difficult doing work/chores Not difficult at all Not difficult at all Somewhat difficult Not difficult at all Not difficult at all      04/20/2023   10:43 AM 03/02/2023    2:16 PM 02/08/2022   10:42 AM 11/30/2021   11:04 AM  GAD  7 : Generalized Anxiety Score  Nervous, Anxious, on Edge 2 3 1 2   Control/stop worrying 2 2 2 2   Worry too much - different things 2 2 1 2   Trouble relaxing 2 2 1 2   Restless 2 1 2 2   Easily annoyed or irritable 2 3 1 1   Afraid - awful might happen 1 1 1 1   Total GAD 7 Score 13 14 9 12   Anxiety Difficulty Not difficult at all Not difficult at all Somewhat difficult Somewhat difficult        Relevant past medical, surgical, family, and social history reviewed and updated as indicated.  Allergies and medications reviewed and updated. Data reviewed: Chart in Epic.   Past Medical History:  Diagnosis Date   Anxiety and depression    Arthritis    Eczema    Mixed hyperlipidemia     Past Surgical History:  Procedure Laterality Date   BIOPSY  04/05/2020   Procedure: BIOPSY;  Surgeon: Lanelle Bal, DO;  [facility];  Service: Endoscopy;;   BIOPSY  06/21/2020   Procedure: BIOPSY;  Surgeon: Lemar Lofty., MD;  [facility]: [facility];  Service:  Gastroenterology;;   CESAREAN SECTION     COLONOSCOPY WITH PROPOFOL N/A 04/05/2020   Procedure: COLONOSCOPY WITH PROPOFOL;  Surgeon: Lanelle Bal, DO;  [facility];  Service: Endoscopy;  Laterality: N/A;  11:00 ASA I/II, pt tested + 1/26   COLONOSCOPY WITH PROPOFOL N/A 06/21/2020   Procedure: COLONOSCOPY WITH PROPOFOL;  Surgeon: Meridee Score Netty Starring., MD;  [facility];  Service: Gastroenterology;  Laterality: N/A;   ENDOSCOPIC MUCOSAL RESECTION N/A 06/21/2020   Procedure: ENDOSCOPIC MUCOSAL RESECTION;  Surgeon: Meridee Score Netty Starring., MD;  [facility];  Service: Gastroenterology;  Laterality: N/A;   HEMOSTASIS CLIP PLACEMENT  06/21/2020   Procedure: HEMOSTASIS CLIP PLACEMENT;  Surgeon: Lemar Lofty., MD;  Location: [facility];  Service: Gastroenterology;;   POLYPECTOMY  04/05/2020   Procedure: POLYPECTOMY;  Surgeon: Lanelle Bal, DO;  [facility];  Service:  Endoscopy;;   POLYPECTOMY  06/21/2020   Procedure: POLYPECTOMY;  Surgeon: Lemar Lofty., MD;  Location: [facility];  Service: Gastroenterology;;    Social History   Socioeconomic History   Marital status: Single    Spouse name: Not on file   Number of children: Not on file   Years of education: Not on file   Highest education level: Not on file  Occupational History   Occupation: [employer]  Tobacco Use   Smoking status: Every Day    Current packs/day: 0.25    Average packs/day: 0.3 packs/day for 30.0 years (7.5 ttl pk-yrs)    Types: Cigarettes   Smokeless tobacco: Never  Vaping Use   Vaping status: Never Used  Substance and Sexual Activity   Alcohol use: Yes    Comment: occ   Drug use: No   Sexual activity: Not on file  Other Topics Concern   Not on file  Social History Narrative   Not on file   Social Drivers of Health   Financial Resource Strain: Not on file  Food Insecurity: Not on file  Transportation Needs: Not on file  Physical Activity: Not on file  Stress: Not on file  Social Connections: Not on file  Intimate Partner Violence: Not At Risk (11/16/2021)   Received from [facility], [facility]   Interpersonal Violence    Patient afraid of, threatened, hurt, or sexually abused by someone known to him/her: No    Outpatient Encounter Medications as of 04/20/2023  Medication Sig   escitalopram (LEXAPRO) 10 MG tablet Take 1 tablet (10 mg total) by mouth daily.   meclizine (ANTIVERT) 25 MG tablet Take 1 tablet (25 mg total) by mouth 3 (three) times daily as needed for dizziness.   No facility-administered encounter medications on file as of 04/20/2023.    No Known Allergies  Pertinent ROS per HPI, otherwise unremarkable      Objective:  BP 113/76   Pulse 76   Temp (!) 97 F (36.1 C)   Ht 5\' 2"  (1.575 m)   Wt 191 lb 9.6 oz (86.9 kg)   SpO2 94%   BMI 35.04 kg/m    Wt Readings from Last 3 Encounters:  04/20/23 191 lb 9.6  oz (86.9 kg)  03/02/23 194 lb 3.2 oz (88.1 kg)  02/08/22 200 lb (90.7 kg)    Physical Exam Vitals and nursing note reviewed.  Constitutional:      Appearance: Normal appearance. She is obese.  HENT:     Head: Normocephalic and atraumatic.     Nose: Nose normal.     Mouth/Throat:  Mouth: Mucous membranes are moist.  Eyes:     Conjunctiva/sclera: Conjunctivae normal.     Pupils: Pupils are equal, round, and reactive to light.  Cardiovascular:     Rate and Rhythm: Normal rate and regular rhythm.     Heart sounds: Normal heart sounds.  Pulmonary:     Effort: Pulmonary effort is normal.     Breath sounds: Normal breath sounds.  Musculoskeletal:     Cervical back: Neck supple.     Right lower leg: No edema.     Left lower leg: No edema.  Skin:    General: Skin is warm and dry.     Capillary Refill: Capillary refill takes less than 2 seconds.  Neurological:     General: No focal deficit present.     Mental Status: She is alert and oriented to person, place, and time.  Psychiatric:        Mood and Affect: Mood normal.        Behavior: Behavior normal.        Thought Content: Thought content normal.        Judgment: Judgment normal.      Results for orders placed or performed in visit on 03/02/23  Bayer DCA Hb A1c Waived   Collection Time: 03/02/23  2:47 PM  Result Value Ref Range   HB A1C (BAYER DCA - WAIVED) 5.2 4.8 - 5.6 %  Anemia Profile B   Collection Time: 03/02/23  2:49 PM  Result Value Ref Range   Total Iron Binding Capacity 263 250 - 450 ug/dL   UIBC 782 956 - 213 ug/dL   Iron 55 27 - 086 ug/dL   Iron Saturation 21 15 - 55 %   Ferritin 155 (H) 15 - 150 ng/mL   Vitamin B-12 274 232 - 1,245 pg/mL   Folate 5.8 >3.0 ng/mL   WBC 7.3 3.4 - 10.8 x10E3/uL   RBC 5.07 3.77 - 5.28 x10E6/uL   Hemoglobin 14.1 11.1 - 15.9 g/dL   Hematocrit 57.8 46.9 - 46.6 %   MCV 86 79 - 97 fL   MCH 27.8 26.6 - 33.0 pg   MCHC 32.3 31.5 - 35.7 g/dL   RDW 62.9 52.8 - 41.3 %    Platelets 287 150 - 450 x10E3/uL   Neutrophils 57 Not Estab. %   Lymphs 28 Not Estab. %   Monocytes 11 Not Estab. %   Eos 2 Not Estab. %   Basos 1 Not Estab. %   Neutrophils Absolute 4.2 1.4 - 7.0 x10E3/uL   Lymphocytes Absolute 2.1 0.7 - 3.1 x10E3/uL   Monocytes Absolute 0.8 0.1 - 0.9 x10E3/uL   EOS (ABSOLUTE) 0.1 0.0 - 0.4 x10E3/uL   Basophils Absolute 0.1 0.0 - 0.2 x10E3/uL   Immature Granulocytes 1 Not Estab. %   Immature Grans (Abs) 0.0 0.0 - 0.1 x10E3/uL   Retic Ct Pct 1.2 0.6 - 2.6 %  CMP14+EGFR   Collection Time: 03/02/23  2:49 PM  Result Value Ref Range   Glucose 90 70 - 99 mg/dL   BUN 12 6 - 24 mg/dL   Creatinine, Ser 2.44 (L) 0.57 - 1.00 mg/dL   eGFR 010 >27 OZ/DGU/4.40   BUN/Creatinine Ratio 22 9 - 23   Sodium 137 134 - 144 mmol/L   Potassium 4.2 3.5 - 5.2 mmol/L   Chloride 100 96 - 106 mmol/L   CO2 24 20 - 29 mmol/L   Calcium 9.1 8.7 - 10.2 mg/dL   Total Protein 6.5  6.0 - 8.5 g/dL   Albumin 3.9 3.8 - 4.9 g/dL   Globulin, Total 2.6 1.5 - 4.5 g/dL   Bilirubin Total 0.5 0.0 - 1.2 mg/dL   Alkaline Phosphatase 105 44 - 121 IU/L   AST 15 0 - 40 IU/L   ALT 16 0 - 32 IU/L  Lipid panel   Collection Time: 03/02/23  2:49 PM  Result Value Ref Range   Cholesterol, Total 232 (H) 100 - 199 mg/dL   Triglycerides 409 (H) 0 - 149 mg/dL   HDL 44 >81 mg/dL   VLDL Cholesterol Cal 30 5 - 40 mg/dL   LDL Chol Calc (NIH) 191 (H) 0 - 99 mg/dL   Chol/HDL Ratio 5.3 (H) 0.0 - 4.4 ratio  Thyroid Panel With TSH   Collection Time: 03/02/23  2:49 PM  Result Value Ref Range   TSH 1.580 0.450 - 4.500 uIU/mL   T4, Total 9.3 4.5 - 12.0 ug/dL   T3 Uptake Ratio 24 24 - 39 %   Free Thyroxine Index 2.2 1.2 - 4.9       Pertinent labs & imaging results that were available during my care of the patient were reviewed by me and considered in my medical decision making.  Assessment & Plan:  Tamara "Marchelle Folks" was seen today for dizziness.      Vertigo Significant improvement with only one dizzy  spell since the last visit. Meclizine is effective on an as-needed basis. No associated nausea or vomiting. - Continue meclizine as needed for dizziness  Cardiac Arrhythmia Scheduled for evaluation with cardiologist Dr. Storm Frisk on April 10th. - Attend cardiology appointment for further evaluation and management  Depression and Anxiety Currently on Lexapro with no significant symptoms. Discussed potential dosage increase or alternative options if symptoms arise. - Continue Lexapro as prescribed  Hyperlipidemia Cholesterol levels elevated in January. Advised dietary changes to reduce fried and greasy foods and incorporate healthy fats like salmon and avocado. - Implement dietary changes - Follow up in six months to reassess cholesterol levels          Continue all other maintenance medications.  Follow up plan: Return in about 6 months (around 10/21/2023), or if symptoms worsen or fail to improve.   Continue healthy lifestyle choices, including diet (rich in fruits, vegetables, and lean proteins, and low in salt and simple carbohydrates) and exercise (at least 30 minutes of moderate physical activity daily).  Educational handout given for vertigo  The above assessment and management plan was discussed with the patient. The patient verbalized understanding of and has agreed to the management plan. Patient is aware to call the clinic if they develop any new symptoms or if symptoms persist or worsen. Patient is aware when to return to the clinic for a follow-up visit. Patient educated on when it is appropriate to go to the emergency department.   Kari Baars, FNP-C [facility] [phone]

## 2023-05-03 ENCOUNTER — Other Ambulatory Visit: Payer: Self-pay | Admitting: Family Medicine

## 2023-05-03 DIAGNOSIS — Z0000 Encounter for general adult medical examination without abnormal findings: Secondary | ICD-10-CM

## 2023-05-24 ENCOUNTER — Ambulatory Visit: Payer: BC Managed Care – PPO | Attending: Internal Medicine | Admitting: Internal Medicine

## 2023-05-24 ENCOUNTER — Encounter: Payer: Self-pay | Admitting: Internal Medicine

## 2023-05-24 VITALS — BP 115/80 | HR 77 | Ht 62.0 in | Wt 191.2 lb

## 2023-05-24 DIAGNOSIS — I491 Atrial premature depolarization: Secondary | ICD-10-CM | POA: Insufficient documentation

## 2023-05-24 DIAGNOSIS — R55 Syncope and collapse: Secondary | ICD-10-CM | POA: Diagnosis not present

## 2023-05-24 DIAGNOSIS — R42 Dizziness and giddiness: Secondary | ICD-10-CM | POA: Insufficient documentation

## 2023-05-24 MED ORDER — MECLIZINE HCL 25 MG PO TABS: 25.0000 mg | ORAL_TABLET | Freq: Three times a day (TID) | ORAL | 0 refills | Status: DC

## 2023-05-24 NOTE — Progress Notes (Signed)
 Cardiology Office Note  Date: 05/24/2023   ID: JONAYA FRESHOUR, DOB 1965/08/06, MRN [id number]  PCP:  Sonny Masters, FNP  Cardiologist:  None Electrophysiologist:  None   History of Present Illness: Tamara Mendoza is a 58 y.o. female known to have HLD was referred to cardiology clinic for evaluation of tachybradycardia syndrome.  I reviewed the event monitor results with the patient today.  She has occasional palpitations.  She gets dizzy but describes it as room spinning sensation.  She is currently on meclizine 25 mg 3 times daily as needed but she did not have to take it.  No angina or DOE.  No syncope, leg swelling.  Past Medical History:  Diagnosis Date   Anxiety and depression    Arthritis    Eczema    Mixed hyperlipidemia     Past Surgical History:  Procedure Laterality Date   BIOPSY  04/05/2020   Procedure: BIOPSY;  Surgeon: Lanelle Bal, DO;  [facility];  Service: Endoscopy;;   BIOPSY  06/21/2020   Procedure: BIOPSY;  Surgeon: Lemar Lofty., MD;  Location: [facility];  Service: Gastroenterology;;   CESAREAN SECTION     COLONOSCOPY WITH PROPOFOL N/A 04/05/2020   Procedure: COLONOSCOPY WITH PROPOFOL;  Surgeon: Lanelle Bal, DO;  [facility];  Service: Endoscopy;  Laterality: N/A;  11:00 ASA I/II, pt tested + 1/26   COLONOSCOPY WITH PROPOFOL N/A 06/21/2020   Procedure: COLONOSCOPY WITH PROPOFOL;  Surgeon: Meridee Score Netty Starring., MD;  Location: [facility];  Service: Gastroenterology;  Laterality: N/A;   ENDOSCOPIC MUCOSAL RESECTION N/A 06/21/2020   Procedure: ENDOSCOPIC MUCOSAL RESECTION;  Surgeon: Meridee Score Netty Starring., MD;  Location: [facility];  Service: Gastroenterology;  Laterality: N/A;   HEMOSTASIS CLIP PLACEMENT  06/21/2020   Procedure: HEMOSTASIS CLIP PLACEMENT;  Surgeon: Lemar Lofty., MD;  Location: [facility];  Service: Gastroenterology;;   POLYPECTOMY  04/05/2020   Procedure: POLYPECTOMY;  Surgeon: Lanelle Bal, DO;  [facility];  Service: Endoscopy;;   POLYPECTOMY  06/21/2020   Procedure: POLYPECTOMY;  Surgeon: Lemar Lofty., MD;  [facility]: [facility];  Service: Gastroenterology;;    Current Outpatient Medications  Medication Sig Dispense Refill   escitalopram (LEXAPRO) 10 MG tablet Take 1 tablet (10 mg total) by mouth daily. 90 tablet 1   meclizine (ANTIVERT) 25 MG tablet Take 1 tablet (25 mg total) by mouth 3 (three) times daily. 30 tablet 0   No current facility-administered medications for this visit.   Allergies:  Patient has no known allergies.   Social History: The patient  reports that she has been smoking cigarettes. She has a 7.5 pack-year smoking history. She has never used smokeless tobacco. She reports current alcohol use. She reports that she does not use drugs.   Family History: The patient's family history includes Breast cancer in her sister; Cancer in her father; Diabetes in her brother, sister, and sister; Hypertension in her mother; Uterine cancer in her mother.   ROS:  Please see the history of present illness. Otherwise, complete review of systems is positive for none  All other systems are reviewed and negative.   Physical Exam: VS:  BP 115/80   Pulse 77   Ht 5\' 2"  (1.575 m)   Wt 191 lb 3.2 oz (86.7 kg)   SpO2 97%   BMI 34.97 kg/m , BMI Body mass index is 34.97 kg/m.  Wt Readings from Last 3 Encounters:  05/24/23 191  lb 3.2 oz (86.7 kg)  04/20/23 191 lb 9.6 oz (86.9 kg)  03/02/23 194 lb 3.2 oz (88.1 kg)    General: Patient appears comfortable at rest. HEENT: Conjunctiva and lids normal, oropharynx clear with moist mucosa. Neck: Supple, no elevated JVP or carotid bruits, no thyromegaly. Lungs: Clear to auscultation, nonlabored breathing at rest. Cardiac: Regular rate and rhythm, no S3 or significant systolic murmur, no pericardial rub. Abdomen: Soft, nontender, no hepatomegaly, bowel sounds present, no guarding or  rebound. Extremities: No pitting edema, distal pulses 2+. Skin: Warm and dry. Musculoskeletal: No kyphosis. Neuropsychiatric: Alert and oriented x3, affect grossly appropriate.  Recent Labwork: 03/02/2023: ALT 16; AST 15; BUN 12; Creatinine, Ser 0.55; Hemoglobin 14.1; Platelets 287; Potassium 4.2; Sodium 137; TSH 1.580     Component Value Date/Time   CHOL 232 (H) 03/02/2023 1449   TRIG 163 (H) 03/02/2023 1449   HDL 44 03/02/2023 1449   CHOLHDL 5.3 (H) 03/02/2023 1449   LDLCALC 158 (H) 03/02/2023 1449    Assessment and Plan:  Dizziness likely secondary to vertigo: Currently on meclizine 25 mg TID PRN but did not have to take any meclizine.  I reviewed the event monitor results with the patient today.  Event monitor did not reveal any malignant arrhythmias or conduction system abnormalities.  No tachybradycardia syndrome evident.  2.2% PAC burden: Occasional palpitations.  EKG from January 2025 showed normal sinus rhythm.  Due to infrequent symptoms, will not recommend any rate controlling agents at this time.       Medication Adjustments/Labs and Tests Ordered: Current medicines are reviewed at length with the patient today.  Concerns regarding medicines are outlined above.    Disposition:  Follow up prn  Signed Kylar Speelman Verne Spurr, MD, 05/24/2023 5:08 PM    [facility] at [facility] [address] [place], [place], [place] [address]

## 2023-05-24 NOTE — Patient Instructions (Signed)
 Medication Instructions:  Your physician has recommended you make the following change in your medication:  Start taking Meclizine 25 mg three times daily Continue taking all other medications as prescribed  Labwork: None  Testing/Procedures: None  Follow-Up: Your physician recommends that you schedule a follow-up appointment in: As needed  Any Other Special Instructions Will Be Listed Below (If Applicable). Thank you for choosing [facility]!     If you need a refill on your cardiac medications before your next appointment, please call your pharmacy.

## 2023-05-28 ENCOUNTER — Ambulatory Visit

## 2023-06-25 ENCOUNTER — Encounter

## 2023-07-11 ENCOUNTER — Ambulatory Visit
Admission: RE | Admit: 2023-07-11 | Discharge: 2023-07-11 | Disposition: A | Discharge status: Home / Self Care | Source: Ambulatory Visit | Attending: Family Medicine | Admitting: Family Medicine

## 2023-07-11 DIAGNOSIS — Z0000 Encounter for general adult medical examination without abnormal findings: Secondary | ICD-10-CM

## 2023-07-17 ENCOUNTER — Ambulatory Visit: Payer: Self-pay | Admitting: Family Medicine

## 2023-08-16 ENCOUNTER — Ambulatory Visit: Payer: Self-pay | Admitting: Nurse Practitioner

## 2023-08-16 ENCOUNTER — Ambulatory Visit (INDEPENDENT_AMBULATORY_CARE_PROVIDER_SITE_OTHER)

## 2023-08-16 ENCOUNTER — Encounter: Payer: Self-pay | Admitting: Nurse Practitioner

## 2023-08-16 ENCOUNTER — Ambulatory Visit: Payer: Self-pay

## 2023-08-16 ENCOUNTER — Ambulatory Visit: Admitting: Nurse Practitioner

## 2023-08-16 VITALS — BP 119/79 | HR 101 | Temp 97.1°F | Ht 62.0 in | Wt 186.0 lb

## 2023-08-16 DIAGNOSIS — M546 Pain in thoracic spine: Secondary | ICD-10-CM | POA: Diagnosis not present

## 2023-08-16 DIAGNOSIS — R1032 Left lower quadrant pain: Secondary | ICD-10-CM | POA: Diagnosis not present

## 2023-08-16 DIAGNOSIS — R109 Unspecified abdominal pain: Secondary | ICD-10-CM

## 2023-08-16 LAB — URINALYSIS, COMPLETE
Bilirubin, UA: NEGATIVE
Glucose, UA: NEGATIVE
Leukocytes,UA: NEGATIVE
Nitrite, UA: NEGATIVE
Specific Gravity, UA: 1.025 (ref 1.005–1.030)
Urobilinogen, Ur: 2 mg/dL — ABNORMAL HIGH (ref 0.2–1.0)
pH, UA: 6 (ref 5.0–7.5)

## 2023-08-16 LAB — MICROSCOPIC EXAMINATION
Renal Epithel, UA: NONE SEEN /HPF
Yeast, UA: NONE SEEN

## 2023-08-16 MED ORDER — METHYLPREDNISOLONE ACETATE 80 MG/ML IJ SUSP
80.0000 mg | Freq: Once | INTRAMUSCULAR | Status: AC
  Administered 2023-08-16: 80 mg via INTRAMUSCULAR

## 2023-08-16 MED ORDER — KETOROLAC TROMETHAMINE 60 MG/2ML IM SOLN
60.0000 mg | Freq: Once | INTRAMUSCULAR | Status: AC
  Administered 2023-08-16: 60 mg via INTRAMUSCULAR

## 2023-08-16 MED ORDER — METHOCARBAMOL 750 MG PO TABS: 750.0000 mg | ORAL_TABLET | Freq: Four times a day (QID) | ORAL | 1 refills | Status: DC

## 2023-08-16 MED ORDER — TRAMADOL HCL 50 MG PO TABS: 50.0000 mg | ORAL_TABLET | Freq: Three times a day (TID) | ORAL | 0 refills | Status: AC | PRN

## 2023-08-16 NOTE — Telephone Encounter (Signed)
 Apt today. LS

## 2023-08-16 NOTE — Patient Instructions (Signed)
 Acute Back Pain, Adult Acute back pain is sudden and usually short-lived. It is often caused by an injury to the muscles and tissues in the back. The injury may result from: A muscle, tendon, or ligament getting overstretched or torn. Ligaments are tissues that connect bones to each other. Lifting something improperly can cause a back strain. Wear and tear (degeneration) of the spinal disks. Spinal disks are circular tissue that provide cushioning between the bones of the spine (vertebrae). Twisting motions, such as while playing sports or doing yard work. A hit to the back. Arthritis. You may have a physical exam, lab tests, and imaging tests to find the cause of your pain. Acute back pain usually goes away with rest and home care. Follow these instructions at home: Managing pain, stiffness, and swelling Take over-the-counter and prescription medicines only as told by your health care provider. Treatment may include medicines for pain and inflammation that are taken by mouth or applied to the skin, or muscle relaxants. Your health care provider may recommend applying ice during the first 24-48 hours after your pain starts. To do this: Put ice in a plastic bag. Place a towel between your skin and the bag. Leave the ice on for 20 minutes, 2-3 times a day. Remove the ice if your skin turns bright red. This is very important. If you cannot feel pain, heat, or cold, you have a greater risk of damage to the area. If directed, apply heat to the affected area as often as told by your health care provider. Use the heat source that your health care provider recommends, such as a moist heat pack or a heating pad. Place a towel between your skin and the heat source. Leave the heat on for 20-30 minutes. Remove the heat if your skin turns bright red. This is especially important if you are unable to feel pain, heat, or cold. You have a greater risk of getting burned. Activity  Do not stay in bed. Staying in  bed for more than 1-2 days can delay your recovery. Sit up and stand up straight. Avoid leaning forward when you sit or hunching over when you stand. If you work at a desk, sit close to it so you do not need to lean over. Keep your chin tucked in. Keep your neck drawn back, and keep your elbows bent at a 90-degree angle (right angle). Sit high and close to the steering wheel when you drive. Add lower back (lumbar) support to your car seat, if needed. Take short walks on even surfaces as soon as you are able. Try to increase the length of time you walk each day. Do not sit, drive, or stand in one place for more than 30 minutes at a time. Sitting or standing for long periods of time can put stress on your back. Do not drive or use heavy machinery while taking prescription pain medicine. Use proper lifting techniques. When you bend and lift, use positions that put less stress on your back: Naselle your knees. Keep the load close to your body. Avoid twisting. Exercise regularly as told by your health care provider. Exercising helps your back heal faster and helps prevent back injuries by keeping muscles strong and flexible. Work with a physical therapist to make a safe exercise program, as recommended by your health care provider. Do any exercises as told by your physical therapist. Lifestyle Maintain a healthy weight. Extra weight puts stress on your back and makes it difficult to have good  posture. Avoid activities or situations that make you feel anxious or stressed. Stress and anxiety increase muscle tension and can make back pain worse. Learn ways to manage anxiety and stress, such as through exercise. General instructions Sleep on a firm mattress in a comfortable position. Try lying on your side with your knees slightly bent. If you lie on your back, put a pillow under your knees. Keep your head and neck in a straight line with your spine (neutral position) when using electronic equipment like  smartphones or pads. To do this: Raise your smartphone or pad to look at it instead of bending your head or neck to look down. Put the smartphone or pad at the level of your face while looking at the screen. Follow your treatment plan as told by your health care provider. This may include: Cognitive or behavioral therapy. Acupuncture or massage therapy. Meditation or yoga. Contact a health care provider if: You have pain that is not relieved with rest or medicine. You have increasing pain going down into your legs or buttocks. Your pain does not improve after 2 weeks. You have pain at night. You lose weight without trying. You have a fever or chills. You develop nausea or vomiting. You develop abdominal pain. Get help right away if: You develop new bowel or bladder control problems. You have unusual weakness or numbness in your arms or legs. You feel faint. These symptoms may represent a serious problem that is an emergency. Do not wait to see if the symptoms will go away. Get medical help right away. Call your local emergency services (911 in the [place] Do not drive yourself to the hospital. Summary Acute back pain is sudden and usually short-lived. Use proper lifting techniques. When you bend and lift, use positions that put less stress on your back. Take over-the-counter and prescription medicines only as told by your health care provider, and apply heat or ice as told. This information is not intended to replace advice given to you by your health care provider. Make sure you discuss any questions you have with your health care provider. Document Revised: 04/23/2020 Document Reviewed: 04/23/2020 Elsevier Patient Education  2024 [employer].

## 2023-08-16 NOTE — Telephone Encounter (Signed)
 FYI Only or Action Required?: Action required by provider: update on patient condition.  Patient was last seen in primary care on 04/20/2023 by Severa Rock HERO, FNP. Called Nurse Triage reporting Back Pain. Symptoms began a week or so for back pain, x 1 day for nausea and left shoulder/arm pain, and a few days since vomiting episode from possible dizziness. Interventions attempted: OTC medications: ibuprofen , tylenol, goody powder medication and Rest, hydration, or home remedies. Symptoms are: gradually worsening.  Triage Disposition: Go to ED or PCP/Alternative with Approval, See HCP Within 4 Hours (Or PCP Triage)  Patient/caregiver understands and will follow disposition?: Unsure                        Copied from CRM [id number]. Topic: Clinical - Red Word Triage >> Aug 16, 2023  8:58 AM [name] wrote: Red Word that prompted transfer to Nurse Triage:  Her pain is a 10 out of 10, Pain is in her left lower back and going down her left thigh; No fever, no swelling Reason for Disposition  [1] Arm pains with exertion (e.g., walking) AND [2] pain goes away on resting AND [3] not present now  Patient sounds very sick or weak to the triager  [1] SEVERE back pain (e.g., excruciating, unable to do any normal activities) AND [2] not improved 2 hours after pain medicine  Answer Assessment - Initial Assessment Questions 1. ONSET: When did the pain begin?      A week or so---got worse last night at work 2. LOCATION: Where does it hurt? (upper, mid or lower back)     Lower left side of back 3. SEVERITY: How bad is the pain?  (e.g., Scale 1-10; mild, moderate, or severe)   - MILD (1-3): Doesn't interfere with normal activities.    - MODERATE (4-7): Interferes with normal activities or awakens from sleep.    - SEVERE (8-10): Excruciating pain, unable to do any normal activities.      6-7  patient can't sleep on her left side or lay on that side 4. PATTERN: Is the pain  constant? (e.g., yes, no; constant, intermittent)      Eased off at one point but now it's constant since last night 5. RADIATION: Does the pain shoot into your legs or somewhere else?     Down left thigh 6. CAUSE:  What do you think is causing the back pain?      No 7. BACK OVERUSE:  Any recent lifting of heavy objects, strenuous work or exercise?     Lifts things at work 8. MEDICINES: What have you taken so far for the pain? (e.g., nothing, acetaminophen, NSAIDS)     Ibuprofen , tylenol, goody powder, aleve 9. NEUROLOGIC SYMPTOMS: Do you have any weakness, numbness, or problems with bowel/bladder control?     No 10. OTHER SYMPTOMS: Do you have any other symptoms? (e.g., fever, abdomen pain, burning with urination, blood in urine)       No  Answer Assessment - Initial Assessment Questions 1. ONSET: When did the pain start?     yesterday 2. LOCATION: Where is the pain located?     Left arm  from shoulder down to elbow 3. PAIN: How bad is the pain? (Scale 1-10; or mild, moderate, severe)   - MILD (1-3): Doesn't interfere with normal activities.   - MODERATE (4-7): Interferes with normal activities (e.g., work or school) or awakens from sleep.   - SEVERE (8-10):  Excruciating pain, unable to do any normal activities, unable to hold a cup of water .     A little numbness but it aint that bad 4. WORK OR EXERCISE: Has there been any recent work or exercise that involved this part of the body?     Lifting some heavy things at work 5. CAUSE: What do you think is causing the arm pain?     Lifting heavy things at work 6. OTHER SYMPTOMS: Do you have any other symptoms? (e.g., neck pain, swelling, rash, fever, numbness, weakness)     A little tingly in it  Answer Assessment - Initial Assessment Questions 1. NAUSEA SEVERITY: How bad is the nausea? (e.g., mild, moderate, severe; dehydration, weight loss)   - MILD: loss of appetite without change in eating habits   -  MODERATE: decreased oral intake without significant weight loss, dehydration, or malnutrition   - SEVERE: inadequate caloric or fluid intake, significant weight loss, symptoms of dehydration     Vomited once about three days ago 2. ONSET: When did the nausea begin?     Nausea last night 3. VOMITING: Any vomiting? If Yes, ask: How many times today?     Once three days ago 4. RECURRENT SYMPTOM: Have you had nausea before? If Yes, ask: When was the last time? What happened that time?     ------ 5. CAUSE: What do you think is causing the nausea?     UNSURE     Patient initially called about lower left sided back pain, then patient started adding these additional symptoms: Left shoulder pain, radiating down to her left elbow--patient states she thinks lifting heavy things at work last night may have brought this on but she states it isn't bothering her much right now---just a slight tingle Then patient states she feels some nausea sometimes that started last night She states that she vomited a few days ago and when asked about that vomiting episode she states that she wasn't sure what caused it and that she may have gotten dizzy.  With all these complaints--Patient is advised at this time that the recommendation is that she goes to the Emergency Room to have these symptoms assessed/evaluated by a provider. It is explained to the patient that the Emergency Room would have the capabilities to assess more for whatever the providers want to evaluate.  Patient talked about going to Urgent Care and it was explained to her that with her symptoms at this time the recommendation is that she goes to the Emergency Room. Patient states that she might go to Urgent Care. She is going to call her work at this time.  Called [facility] to advise them of patient's symptoms and not going to the ER at this time.  Protocols used: Back Pain-A-AH, Arm Pain-A-AH, Nausea-A-AH

## 2023-08-16 NOTE — Progress Notes (Signed)
 Subjective:    Patient ID: Tamara Mendoza, female    DOB: 03/24/1965, 58 y.o.   MRN: [id number]   Chief Complaint: Left lower abdominal pain   HPI Patient actually in c/o pain in left mid back that radiates around to left lower abdomen. Started a week ago and was intermittent but now is constant. Rates pain 9/10. Pain radiates down left leg.  Patient Active Problem List   Diagnosis Date Noted   Vertigo 05/24/2023   PAC (premature atrial contraction) 05/24/2023   Recurrent depression (HCC) 04/20/2023   GAD (generalized anxiety disorder) 04/20/2023   Bilateral primary osteoarthritis of knee 11/04/2020   Obesity (BMI 30.0-34.9) 09/09/2020   OA (osteoarthritis) of knee 06/10/2020   Arthritis 04/01/2020   Mixed hyperlipidemia    Difficulty sleeping 11/16/2019   Hot flashes 11/16/2019   Eczema of both hands 11/16/2019   Tobacco use 11/13/2019   Anxiety and depression 10/02/2019       Review of Systems  Constitutional:  Negative for diaphoresis.  Eyes:  Negative for pain.  Respiratory:  Negative for shortness of breath.   Cardiovascular:  Negative for chest pain, palpitations and leg swelling.  Gastrointestinal:  Negative for abdominal pain.  Endocrine: Negative for polydipsia.  Skin:  Negative for rash.  Neurological:  Negative for dizziness, weakness and headaches.  Hematological:  Does not bruise/bleed easily.  All other systems reviewed and are negative.      Objective:   Physical Exam Constitutional:      Appearance: Normal appearance.  Cardiovascular:     Rate and Rhythm: Normal rate and regular rhythm.  Abdominal:     Tenderness: There is no abdominal tenderness. There is left CVA tenderness.  Musculoskeletal:     Comments: Decrease ROM of thoracic spine with pain on extension and rotation.   Skin:    General: Skin is warm.  Neurological:     General: No focal deficit present.     Mental Status: She is alert and oriented to person, place, and time.   Psychiatric:        Mood and Affect: Mood normal.        Behavior: Behavior normal.      BP 119/79   Pulse (!) 101   Temp (!) 97.1 F (36.2 C) (Temporal)   Ht 5' 2 (1.575 m)   Wt 186 lb (84.4 kg)   SpO2 91%   BMI 34.02 kg/m   Kub- no kidney stone visible but could be due to stool in colon.-Preliminary reading by Ronal Lunger, FNP  [facility]  UA- trace of blood-Mary-Margaret Lunger, FNP     Assessment & Plan:  Tamara Mendoza in today with chief complaint of Left lower abdominal pain   1. Left lower quadrant abdominal pain (Primary) - DG Abd 1 View  2. Left flank pain Possible kidney stone Force fluids - Urinalysis, Complete  3. Acute left-sided thoracic back pain Moist heat Rest Robaxin sedation precautions RTO prn - methylPREDNISolone  acetate (DEPO-MEDROL ) injection 80 mg - ketorolac (TORADOL) injection 60 mg - methocarbamol (ROBAXIN) 750 MG tablet; Take 1 tablet (750 mg total) by mouth 4 (four) times daily.  Dispense: 30 tablet; Refill: 1 - traMADol (ULTRAM) 50 MG tablet; Take 1 tablet (50 mg total) by mouth every 8 (eight) hours as needed for up to 5 days.  Dispense: 15 tablet; Refill: 0    The above assessment and management plan was discussed with the patient. The patient verbalized understanding of and has agreed to  the management plan. Patient is aware to call the clinic if symptoms persist or worsen. Patient is aware when to return to the clinic for a follow-up visit. Patient educated on when it is appropriate to go to the emergency department.   Mary-Margaret Gladis, FNP

## 2023-08-21 ENCOUNTER — Ambulatory Visit: Payer: Self-pay

## 2023-08-21 NOTE — Telephone Encounter (Signed)
 FYI Only or Action Required?: FYI only for provider.  Patient was last seen in primary care on 08/16/2023 by Gladis Mustard, FNP.  Called Nurse Triage reporting Hip Pain.  Symptoms began several weeks ago.  Interventions attempted: Prescription medications: tramadol  and robaxin .  Symptoms are: gradually worsening.  Triage Disposition: See PCP When Office is Open (Within 3 Days)  Patient/caregiver understands and will follow disposition?: Yes            Copied from CRM [id number]. Topic: Clinical - Red Word Triage >> Aug 21, 2023 10:29 AM Gustabo D wrote: Patient says her pain is getting worse it's in her hip and going down her leg. Reason for Disposition  [1] MODERATE pain (e.g., interferes with normal activities, limping) AND [2] present > 3 days  Answer Assessment - Initial Assessment Questions 1. LOCATION and RADIATION: Where is the pain located?      Left hip pain 2. QUALITY: What does the pain feel like?  (e.g., sharp, dull, aching, burning)     Sharp and burning 3. SEVERITY: How bad is the pain? What does it keep you from doing?   (Scale 1-10; or mild, moderate, severe)   -  MILD (1-3): doesn't interfere with normal activities    -  MODERATE (4-7): interferes with normal activities (e.g., work or school) or awakens from sleep, limping    -  SEVERE (8-10): excruciating pain, unable to do any normal activities, unable to walk     severe 4. ONSET: When did the pain start? Does it come and go, or is it there all the time?     Over a week ago 5. WORK OR EXERCISE: Has there been any recent work or exercise that involved this part of the body?      no 6. CAUSE: What do you think is causing the hip pain?      unknown 7. AGGRAVATING FACTORS: What makes the hip pain worse? (e.g., walking, climbing stairs, running)     Walking a lot 8. OTHER SYMPTOMS: Do you have any other symptoms? (e.g., back pain, pain shooting down leg,  fever, rash)     Thigh  and left leg  Protocols used: Hip Pain-A-AH

## 2023-08-22 ENCOUNTER — Encounter: Payer: Self-pay | Admitting: Family Medicine

## 2023-08-22 ENCOUNTER — Ambulatory Visit: Admitting: Family Medicine

## 2023-08-22 ENCOUNTER — Ambulatory Visit (INDEPENDENT_AMBULATORY_CARE_PROVIDER_SITE_OTHER)

## 2023-08-22 VITALS — BP 132/78 | HR 69 | Temp 96.9°F | Ht 62.0 in | Wt 185.6 lb

## 2023-08-22 DIAGNOSIS — K5900 Constipation, unspecified: Secondary | ICD-10-CM | POA: Diagnosis not present

## 2023-08-22 DIAGNOSIS — M25552 Pain in left hip: Secondary | ICD-10-CM

## 2023-08-22 MED ORDER — POLYETHYLENE GLYCOL 3350 17 GM/SCOOP PO POWD: 17.0000 g | Freq: Every day | ORAL | 1 refills | Status: DC

## 2023-08-22 MED ORDER — METHYLPREDNISOLONE ACETATE 80 MG/ML IJ SUSP
80.0000 mg | Freq: Once | INTRAMUSCULAR | Status: AC
  Administered 2023-08-22: 80 mg via INTRAMUSCULAR

## 2023-08-22 NOTE — Progress Notes (Signed)
 Subjective:  Patient ID: Tamara Mendoza, female    DOB: March 24, 1965, 58 y.o.   MRN: [id number]  Patient Care Team: Severa Rock HERO, FNP as PCP - General (Family Medicine) Cindie Carlin POUR, DO as Consulting Physician (Internal Medicine) Billee Mliss BIRCH, [facility] as Pharmacist (Family Medicine)   Chief Complaint:  ER follow up  (08/18/2023/[facility]- left hip pain that has not improved.  States it is numb and has heat to it. )   HPI: Tamara Mendoza is a 58 y.o. female presenting on 08/22/2023 for ER follow up  (08/18/2023/[facility]- left hip pain that has not improved.  States it is numb and has heat to it. )   The patient, with lower lumbar degenerative disc disease, presents with left hip pain.  They have been experiencing pain in the left hip and lower buttock for about a week. The pain is described as having a lot of heat and numbness, and it radiates down the leg. No recent injuries or fevers are reported.  They have been taking meloxicam , tramadol , and Robaxin  as prescribed for pain management, but these medications have not been effective in alleviating the pain. The pain significantly impacts their daily activities, making it difficult to work, sleep on the affected side, and perform basic tasks such as using the bathroom.  An abdominal x-ray was performed a few days ago, which noted constipation. They have previously seen an orthopedic specialist for their knee, but not for the current hip issue.       Relevant past medical, surgical, family, and social history reviewed and updated as indicated.  Allergies and medications reviewed and updated. Data reviewed: Chart in Epic.   Past Medical History:  Diagnosis Date   Anxiety and depression    Arthritis    Eczema    Mixed hyperlipidemia     Past Surgical History:  Procedure Laterality Date   BIOPSY  04/05/2020   Procedure: BIOPSY;  Surgeon: Cindie Carlin POUR, DO;  Location: [facility];  Service:  Endoscopy;;   BIOPSY  06/21/2020   Procedure: BIOPSY;  Surgeon: Wilhelmenia Aloha Raddle., MD;  Location: [facility];  Service: Gastroenterology;;   CESAREAN SECTION     COLONOSCOPY WITH PROPOFOL  N/A 04/05/2020   Procedure: COLONOSCOPY WITH PROPOFOL ;  Surgeon: Cindie Carlin POUR, DO;  Location: [facility];  Service: Endoscopy;  Laterality: N/A;  11:00 ASA I/II, pt tested + 1/26   COLONOSCOPY WITH PROPOFOL  N/A 06/21/2020   Procedure: COLONOSCOPY WITH PROPOFOL ;  Surgeon: Wilhelmenia Aloha Raddle., MD;  Location: [facility];  Service: Gastroenterology;  Laterality: N/A;   ENDOSCOPIC MUCOSAL RESECTION N/A 06/21/2020   Procedure: ENDOSCOPIC MUCOSAL RESECTION;  Surgeon: Wilhelmenia Aloha Raddle., MD;  Location: [facility];  Service: Gastroenterology;  Laterality: N/A;   HEMOSTASIS CLIP PLACEMENT  06/21/2020   Procedure: HEMOSTASIS CLIP PLACEMENT;  Surgeon: Wilhelmenia Aloha Raddle., MD;  Location: [facility];  Service: Gastroenterology;;   POLYPECTOMY  04/05/2020   Procedure: POLYPECTOMY;  Surgeon: Cindie Carlin POUR, DO;  [facility];  Service: Endoscopy;;   POLYPECTOMY  06/21/2020   Procedure: POLYPECTOMY;  Surgeon: Wilhelmenia Aloha Raddle., MD;  Location: [facility];  Service: Gastroenterology;;    Social History   Socioeconomic History   Marital status: Single    Spouse name: Not on file   Number of children: Not on file   Years of education: Not on file   Highest education level: Not on file  Occupational History   Occupation:  [employer]  Tobacco Use   Smoking status: Every Day    Current packs/day: 0.25    Average packs/day: 0.3 packs/day for 30.0 years (7.5 ttl pk-yrs)    Types: Cigarettes   Smokeless tobacco: Never  Vaping Use   Vaping status: Never Used  Substance and Sexual Activity   Alcohol use: Yes    Comment: occ   Drug use: No   Sexual activity: Not on file  Other Topics Concern   Not on file  Social History Narrative   Not on file   Social Drivers of Health    Financial Resource Strain: Not on file  Food Insecurity: Not on file  Transportation Needs: Not on file  Physical Activity: Not on file  Stress: Not on file  Social Connections: Not on file  Intimate Partner Violence: Not At Risk (11/16/2021)   Received from [facility]   Interpersonal Violence    Patient afraid of, threatened, hurt, or sexually abused by someone known to him/her: No    Outpatient Encounter Medications as of 08/22/2023  Medication Sig   escitalopram  (LEXAPRO ) 10 MG tablet Take 1 tablet (10 mg total) by mouth daily.   meloxicam  (MOBIC ) 7.5 MG tablet Take 7.5 mg by mouth.   methocarbamol  (ROBAXIN ) 750 MG tablet Take 1 tablet (750 mg total) by mouth 4 (four) times daily.   polyethylene glycol powder (GLYCOLAX /MIRALAX ) 17 GM/SCOOP powder Take 17 g by mouth daily.   traMADol  (ULTRAM ) 50 MG tablet Take 50 mg by mouth every 6 (six) hours as needed.   [DISCONTINUED] meclizine  (ANTIVERT ) 25 MG tablet Take 1 tablet (25 mg total) by mouth 3 (three) times daily. (Patient not taking: Reported on 08/16/2023)   [EXPIRED] methylPREDNISolone  acetate (DEPO-MEDROL ) injection 80 mg    No facility-administered encounter medications on file as of 08/22/2023.    No Known Allergies  Pertinent ROS per HPI, otherwise unremarkable      Objective:  BP 132/78   Pulse 69   Temp (!) 96.9 F (36.1 C)   Ht 5' 2 (1.575 m)   Wt 185 lb 9.6 oz (84.2 kg)   SpO2 94%   BMI 33.95 kg/m    Wt Readings from Last 3 Encounters:  08/22/23 185 lb 9.6 oz (84.2 kg)  08/16/23 186 lb (84.4 kg)  05/24/23 191 lb 3.2 oz (86.7 kg)    Physical Exam Vitals and nursing note reviewed.  Constitutional:      General: She is in acute distress.     Appearance: Normal appearance. She is well-developed and well-groomed. She is obese. She is not ill-appearing, toxic-appearing or diaphoretic.  HENT:     Head: Normocephalic and atraumatic.     Jaw: There is normal jaw occlusion.     Right Ear: Hearing  normal.     Left Ear: Hearing normal.     Nose: Nose normal.     Mouth/Throat:     Lips: Pink.     Mouth: Mucous membranes are moist.     Pharynx: Oropharynx is clear. Uvula midline.  Eyes:     General: Lids are normal.     Extraocular Movements: Extraocular movements intact.     Conjunctiva/sclera: Conjunctivae normal.     Pupils: Pupils are equal, round, and reactive to light.  Neck:     Thyroid : No thyroid  mass, thyromegaly or thyroid  tenderness.     Vascular: No carotid bruit or JVD.     Trachea: Trachea and phonation normal.  Cardiovascular:     Rate  and Rhythm: Normal rate and regular rhythm.     Chest Wall: PMI is not displaced.     Pulses: Normal pulses.     Heart sounds: Normal heart sounds. No murmur heard.    No friction rub. No gallop.  Pulmonary:     Effort: Pulmonary effort is normal. No respiratory distress.     Breath sounds: Normal breath sounds. No wheezing.  Abdominal:     General: Bowel sounds are normal. There is no distension or abdominal bruit.     Palpations: Abdomen is soft. There is no hepatomegaly or splenomegaly.     Tenderness: There is no abdominal tenderness. There is no right CVA tenderness or left CVA tenderness.     Hernia: No hernia is present.  Musculoskeletal:     Cervical back: Normal range of motion and neck supple.     Thoracic back: Normal.     Lumbar back: No swelling, edema, deformity, signs of trauma, lacerations, spasms, tenderness or bony tenderness. Decreased range of motion. Positive left straight leg raise test. Negative right straight leg raise test. No scoliosis.     Left hip: Tenderness present. No deformity, lacerations, bony tenderness or crepitus. Decreased range of motion. Normal strength.     Left upper leg: Normal.     Right lower leg: No edema.     Left lower leg: No edema.  Lymphadenopathy:     Cervical: No cervical adenopathy.  Skin:    General: Skin is warm and dry.     Capillary Refill: Capillary refill takes  less than 2 seconds.     Coloration: Skin is not cyanotic, jaundiced or pale.     Findings: No rash.  Neurological:     General: No focal deficit present.     Mental Status: She is alert and oriented to person, place, and time.     Sensory: Sensation is intact.     Motor: Motor function is intact.     Coordination: Coordination is intact.     Gait: Gait is intact.     Deep Tendon Reflexes: Reflexes are normal and symmetric.  Psychiatric:        Attention and Perception: Attention and perception normal.        Mood and Affect: Mood and affect normal.        Speech: Speech normal.        Behavior: Behavior normal. Behavior is cooperative.        Thought Content: Thought content normal.        Cognition and Memory: Cognition and memory normal.        Judgment: Judgment normal.    X-Ray: left hip: degenerative changes, constipation, No acute findings. Preliminary x-ray reading by Rosaline Bruns, FNP-C, [facility].   Results for orders placed or performed in visit on 08/16/23  Microscopic Examination   Collection Time: 08/16/23 12:39 PM   Urine  Result Value Ref Range   WBC, UA 0-5 0 - 5 /hpf   RBC, Urine 0-2 0 - 2 /hpf   Epithelial Cells (non renal) 0-10 0 - 10 /hpf   Renal Epithel, UA None seen None seen /hpf   Mucus, UA Present (A) Not Estab.   Bacteria, UA Few (A) None seen/Few   Yeast, UA None seen None seen  Urinalysis, Complete   Collection Time: 08/16/23 12:39 PM  Result Value Ref Range   Specific Gravity, UA 1.025 1.005 - 1.030   pH, UA 6.0 5.0 - 7.5   Color, UA  Yellow Yellow   Appearance Ur Clear Clear   Leukocytes,UA Negative Negative   Protein,UA 2+ (A) Negative/Trace   Glucose, UA Negative Negative   Ketones, UA 1+ (A) Negative   RBC, UA Trace (A) Negative   Bilirubin, UA Negative Negative   Urobilinogen, Ur 2.0 (H) 0.2 - 1.0 mg/dL   Nitrite, UA Negative Negative   Microscopic Examination See below:        Pertinent labs & imaging results that were available  during my care of the patient were reviewed by me and considered in my medical decision making.  Assessment & Plan:  Tamara Mendoza was seen today for er follow up .  Diagnoses and all orders for this visit:  Left hip pain -     DG HIP UNILAT W OR W/O PELVIS 2-3 VIEWS LEFT -     Ambulatory referral to Orthopedic Surgery -     methylPREDNISolone  acetate (DEPO-MEDROL ) injection 80 mg  Constipation in female -     polyethylene glycol powder (GLYCOLAX /MIRALAX ) 17 GM/SCOOP powder; Take 17 g by mouth daily.       Left Hip Osteoarthritis Left hip pain radiating down the leg, severe enough to interfere with daily activities. X-ray shows significant arthritic changes with bone-on-bone contact, indicating osteoarthritis. Current medications (meloxicam , tramadol , and Robaxin ) are not providing adequate relief. A steroid injection is proposed to reduce inflammation and enhance the effectiveness of current medications. Referral to orthopedics is planned for further evaluation and management. - Administer a steroid injection to reduce inflammation and pain. - Continue meloxicam , tramadol , and Robaxin . - Refer to orthopedics for further evaluation and management. - Provide a work note for absence until Monday.  Constipation X-ray reveals a significant stool burden, indicating constipation, which may be contributing to lower back and abdominal pain. MiraLAX  is recommended to relieve constipation and potentially reduce associated pain. - Recommend MiraLAX  daily with at least 12 ounces of water  to relieve constipation.          Continue all other maintenance medications.  Follow up plan: Return if symptoms worsen or fail to improve.   Continue healthy lifestyle choices, including diet (rich in fruits, vegetables, and lean proteins, and low in salt and simple carbohydrates) and exercise (at least 30 minutes of moderate physical activity daily).  Educational handout given for hip pain  The above  assessment and management plan was discussed with the patient. The patient verbalized understanding of and has agreed to the management plan. Patient is aware to call the clinic if they develop any new symptoms or if symptoms persist or worsen. Patient is aware when to return to the clinic for a follow-up visit. Patient educated on when it is appropriate to go to the emergency department.   Rosaline Bruns, FNP-C [facility] [phone]

## 2023-08-23 ENCOUNTER — Telehealth: Payer: Self-pay | Admitting: Family Medicine

## 2023-08-23 NOTE — Telephone Encounter (Signed)
 Copied from CRM [id number]. Topic: General - Call Back - No Documentation >> Aug 23, 2023  4:28 PM Geneva B wrote: Reason for CRM: PATIENT CALLING TO GIVE CARD INFO TO FAX PAPER WORK PLEASE CALL PT BACK

## 2023-08-23 NOTE — Telephone Encounter (Signed)
 [employer] faxed Disability forms to be completed and signed.  Form Fee Paid? (Y/N)      No-Pt to call back to pay      If NO, form is placed on front office manager desk to hold until payment received. If YES, then form will be placed in the RX/HH Nurse Coordinators box for completion.  Form will not be processed until payment is received   Please fax & pt wants a copy of forms.

## 2023-08-24 DIAGNOSIS — Z0279 Encounter for issue of other medical certificate: Secondary | ICD-10-CM

## 2023-08-24 NOTE — Telephone Encounter (Signed)
 Tamara Mendoza is this the patient you got the fax number for, just wanting to make sure it was taken care of.

## 2023-08-24 NOTE — Telephone Encounter (Signed)
 Pt paid for ppw today

## 2023-08-26 ENCOUNTER — Ambulatory Visit: Payer: Self-pay | Admitting: Family Medicine

## 2023-08-27 NOTE — Telephone Encounter (Addendum)
 Informed pt there is information on front of paperwork w/ fax #. Paperwork was faxed late Friday afternoon to [facility] fax # [phone]. Copy at front desk

## 2023-08-27 NOTE — Telephone Encounter (Signed)
Pt aware in another encounter.

## 2023-09-01 ENCOUNTER — Other Ambulatory Visit: Payer: Self-pay | Admitting: Family Medicine

## 2023-09-01 DIAGNOSIS — F411 Generalized anxiety disorder: Secondary | ICD-10-CM

## 2023-09-01 DIAGNOSIS — F339 Major depressive disorder, recurrent, unspecified: Secondary | ICD-10-CM

## 2023-10-23 ENCOUNTER — Ambulatory Visit: Admitting: Family Medicine

## 2023-11-02 ENCOUNTER — Ambulatory Visit: Admitting: Family Medicine

## 2023-11-16 ENCOUNTER — Ambulatory Visit: Admitting: Family Medicine

## 2023-11-30 ENCOUNTER — Other Ambulatory Visit: Payer: Self-pay | Admitting: Family Medicine

## 2023-11-30 DIAGNOSIS — F411 Generalized anxiety disorder: Secondary | ICD-10-CM

## 2023-11-30 DIAGNOSIS — F339 Major depressive disorder, recurrent, unspecified: Secondary | ICD-10-CM

## 2023-12-17 ENCOUNTER — Encounter: Payer: Self-pay | Admitting: Radiology

## 2023-12-21 ENCOUNTER — Ambulatory Visit: Payer: Self-pay | Admitting: Family Medicine

## 2024-01-04 ENCOUNTER — Encounter: Payer: Self-pay | Admitting: Family Medicine

## 2024-01-04 ENCOUNTER — Ambulatory Visit: Admitting: Family Medicine

## 2024-01-04 VITALS — BP 108/71 | HR 81 | Temp 97.4°F | Ht 62.0 in | Wt 182.0 lb

## 2024-01-04 DIAGNOSIS — F331 Major depressive disorder, recurrent, moderate: Secondary | ICD-10-CM | POA: Diagnosis not present

## 2024-01-04 DIAGNOSIS — E782 Mixed hyperlipidemia: Secondary | ICD-10-CM

## 2024-01-04 DIAGNOSIS — F411 Generalized anxiety disorder: Secondary | ICD-10-CM | POA: Diagnosis not present

## 2024-01-04 DIAGNOSIS — F339 Major depressive disorder, recurrent, unspecified: Secondary | ICD-10-CM

## 2024-01-04 MED ORDER — ESCITALOPRAM OXALATE 20 MG PO TABS: 20.0000 mg | ORAL_TABLET | Freq: Every day | ORAL | 3 refills | Status: DC

## 2024-01-04 NOTE — Progress Notes (Signed)
 Subjective:  Patient ID: Tamara Mendoza, female    DOB: [birth year], 58 y.o.   MRN: [id number]  Patient Care Team: Severa Rock HERO, FNP as PCP - General (Family Medicine) Cindie Carlin POUR, DO as Consulting Physician (Internal Medicine) Billee Mliss BIRCH, [facility] as Pharmacist (Family Medicine)   Chief Complaint:  Medical Management of Chronic Issues   HPI: Tamara Mendoza is a 58 y.o. female presenting on 01/04/2024 for Medical Management of Chronic Issues    Tamara Mendoza is a 57 year old female who presents for a checkup and medication management.   She has ongoing issues with hemorrhoids but does not currently have a gastrointestinal doctor. She plans on seeing GI at the first of the year.  She has noticed the development of hard spots on her foot, which she describes as calluses or 'little knots and hard,' and which have been present for some time. These are located on one foot and are described as 'little knots and hard.' They do not cause pain but are concerning due to their persistence and spread. She has been trying to manage them by filing them down. She is unsure of the cause but speculates it could be related to her shoes.  She is currently taking Lexapro  for anxiety and depression. Her anxiety and depression have been more pronounced recently, with increased irritability and snapping at others. Stress from work and the holidays may be contributing factors. The dose of Lexapro  was not specified.  No recent heart issues, including skipped beats, and her heart health has been stable since her last visit to the cardiologist, who confirmed that everything was good.     01/04/2024   10:36 AM 08/22/2023    2:27 PM 08/16/2023   12:15 PM 04/20/2023   10:42 AM 03/02/2023    2:16 PM  Depression screen PHQ 2/9  Decreased Interest 2 2 2 2 2   Down, Depressed, Hopeless 2 0 1 2 2   PHQ - 2 Score 4 2 3 4 4   Altered sleeping 3 2 2 2 3   Tired, decreased energy 3 2 2 2 3   Change in appetite 2  2 2 2 2   Feeling bad or failure about yourself  0 0 0 2 1  Trouble concentrating 0 1 2 2 2   Moving slowly or fidgety/restless 0 0 0 2 0  Suicidal thoughts 0 0 0 0 0  PHQ-9 Score 12 9  11  16  15    Difficult doing work/chores Not difficult at all Not difficult at all Not difficult at all Not difficult at all Not difficult at all     Data saved with a previous flowsheet row definition      01/04/2024   10:36 AM 08/22/2023    2:27 PM 04/20/2023   10:43 AM 03/02/2023    2:16 PM  GAD 7 : Generalized Anxiety Score  Nervous, Anxious, on Edge 0 1 2 3   Control/stop worrying 2 1 2 2   Worry too much - different things 2 1 2 2   Trouble relaxing 2 1 2 2   Restless 0 0 2 1  Easily annoyed or irritable 2 1 2 3   Afraid - awful might happen 2 1 1 1   Total GAD 7 Score 10 6 13 14   Anxiety Difficulty Not difficult at all Not difficult at all Not difficult at all Not difficult at all          Relevant past medical, surgical, family, and social  history reviewed and updated as indicated.  Allergies and medications reviewed and updated. Data reviewed: Chart in Epic.   Past Medical History:  Diagnosis Date   Anxiety and depression    Arthritis    Eczema    Mixed hyperlipidemia     Past Surgical History:  Procedure Laterality Date   BIOPSY  04/05/2020   Procedure: BIOPSY;  Surgeon: Cindie Carlin POUR, DO;  [facility];  Service: Endoscopy;;   BIOPSY  06/21/2020   Procedure: BIOPSY;  Surgeon: Wilhelmenia Aloha Raddle., MD;  Location: [facility];  Service: Gastroenterology;;   CESAREAN SECTION     COLONOSCOPY WITH PROPOFOL  N/A 04/05/2020   Procedure: COLONOSCOPY WITH PROPOFOL ;  Surgeon: Cindie Carlin POUR, DO;  [facility];  Service: Endoscopy;  Laterality: N/A;  11:00 ASA I/II, pt tested + 1/26   COLONOSCOPY WITH PROPOFOL  N/A 06/21/2020   Procedure: COLONOSCOPY WITH PROPOFOL ;  Surgeon: Wilhelmenia Aloha Raddle., MD;  [facility]: [facility];  Service: Gastroenterology;  Laterality:  N/A;   ENDOSCOPIC MUCOSAL RESECTION N/A 06/21/2020   Procedure: ENDOSCOPIC MUCOSAL RESECTION;  Surgeon: Wilhelmenia Aloha Raddle., MD;  [facility];  Service: Gastroenterology;  Laterality: N/A;   HEMOSTASIS CLIP PLACEMENT  06/21/2020   Procedure: HEMOSTASIS CLIP PLACEMENT;  Surgeon: Wilhelmenia Aloha Raddle., MD;  Location: [facility];  Service: Gastroenterology;;   POLYPECTOMY  04/05/2020   Procedure: POLYPECTOMY;  Surgeon: Cindie Carlin POUR, DO;  [facility];  Service: Endoscopy;;   POLYPECTOMY  06/21/2020   Procedure: POLYPECTOMY;  Surgeon: Wilhelmenia Aloha Raddle., MD;  Location: [facility];  Service: Gastroenterology;;    Social History   Socioeconomic History   Marital status: Single    Spouse name: Not on file   Number of children: Not on file   Years of education: Not on file   Highest education level: Not on file  Occupational History   Occupation: [employer]  Tobacco Use   Smoking status: Every Day    Current packs/day: 0.25    Average packs/day: 0.3 packs/day for 30.0 years (7.5 ttl pk-yrs)    Types: Cigarettes   Smokeless tobacco: Never  Vaping Use   Vaping status: Never Used  Substance and Sexual Activity   Alcohol use: Yes    Comment: occ   Drug use: No   Sexual activity: Not on file  Other Topics Concern   Not on file  Social History Narrative   Not on file   Social Drivers of Health   Financial Resource Strain: Not on file  Food Insecurity: Not on file  Transportation Needs: Not on file  Physical Activity: Not on file  Stress: Not on file  Social Connections: Not on file  Intimate Partner Violence: Not At Risk (11/16/2021)   Received from [facility]   Interpersonal Violence    Patient afraid of, threatened, hurt, or sexually abused by someone known to him/her: No    Outpatient Encounter Medications as of 01/04/2024  Medication Sig   escitalopram  (LEXAPRO ) 20 MG tablet Take 1 tablet (20 mg total) by mouth daily.    [DISCONTINUED] escitalopram  (LEXAPRO ) 10 MG tablet Take 1 tablet by mouth once daily   [DISCONTINUED] meloxicam  (MOBIC ) 7.5 MG tablet Take 7.5 mg by mouth.   [DISCONTINUED] methocarbamol  (ROBAXIN ) 750 MG tablet Take 1 tablet (750 mg total) by mouth 4 (four) times daily.   [DISCONTINUED] polyethylene glycol powder (GLYCOLAX /MIRALAX ) 17 GM/SCOOP powder Take 17 g by mouth daily.   [DISCONTINUED] traMADol  (ULTRAM ) 50 MG tablet Take  50 mg by mouth every 6 (six) hours as needed.   No facility-administered encounter medications on file as of 01/04/2024.    No Known Allergies  Pertinent ROS per HPI, otherwise unremarkable      Objective:  BP 108/71   Pulse 81   Temp (!) 97.4 F (36.3 C)   Ht 5' 2 (1.575 m)   Wt 182 lb (82.6 kg)   SpO2 95%   BMI 33.29 kg/m    Wt Readings from Last 3 Encounters:  01/04/24 182 lb (82.6 kg)  08/22/23 185 lb 9.6 oz (84.2 kg)  08/16/23 186 lb (84.4 kg)    Physical Exam Vitals and nursing note reviewed.  Constitutional:      General: She is not in acute distress.    Appearance: Normal appearance. She is obese. She is not ill-appearing, toxic-appearing or diaphoretic.  HENT:     Head: Normocephalic and atraumatic.     Nose: Nose normal.     Mouth/Throat:     Mouth: Mucous membranes are moist.  Eyes:     Pupils: Pupils are equal, round, and reactive to light.  Cardiovascular:     Rate and Rhythm: Normal rate and regular rhythm.     Heart sounds: Normal heart sounds.  Pulmonary:     Effort: Pulmonary effort is normal.     Breath sounds: Normal breath sounds.  Musculoskeletal:     Cervical back: Neck supple.     Right lower leg: No edema.     Left lower leg: No edema.       Feet:  Skin:    General: Skin is warm and dry.     Capillary Refill: Capillary refill takes less than 2 seconds.  Neurological:     General: No focal deficit present.     Mental Status: She is alert and oriented to person, place, and time.  Psychiatric:        Mood and  Affect: Mood normal.        Behavior: Behavior normal.        Thought Content: Thought content normal.        Judgment: Judgment normal.       Results for orders placed or performed in visit on 08/16/23  Microscopic Examination   Collection Time: 08/16/23 12:39 PM   Urine  Result Value Ref Range   WBC, UA 0-5 0 - 5 /hpf   RBC, Urine 0-2 0 - 2 /hpf   Epithelial Cells (non renal) 0-10 0 - 10 /hpf   Renal Epithel, UA None seen None seen /hpf   Mucus, UA Present (A) Not Estab.   Bacteria, UA Few (A) None seen/Few   Yeast, UA None seen None seen  Urinalysis, Complete   Collection Time: 08/16/23 12:39 PM  Result Value Ref Range   Specific Gravity, UA 1.025 1.005 - 1.030   pH, UA 6.0 5.0 - 7.5   Color, UA Yellow Yellow   Appearance Ur Clear Clear   Leukocytes,UA Negative Negative   Protein,UA 2+ (A) Negative/Trace   Glucose, UA Negative Negative   Ketones, UA 1+ (A) Negative   RBC, UA Trace (A) Negative   Bilirubin, UA Negative Negative   Urobilinogen, Ur 2.0 (H) 0.2 - 1.0 mg/dL   Nitrite, UA Negative Negative   Microscopic Examination See below:        Pertinent labs & imaging results that were available during my care of the patient were reviewed by me and considered in my medical decision  making.  Assessment & Plan:  Wyatt Galvan was seen today for medical management of chronic issues.  Diagnoses and all orders for this visit:  Recurrent major depressive episodes, moderate (HCC) -     CBC with Differential/Platelet -     CMP14+EGFR -     escitalopram  (LEXAPRO ) 20 MG tablet; Take 1 tablet (20 mg total) by mouth daily. -     TSH -     T4, Free  GAD (generalized anxiety disorder) -     CBC with Differential/Platelet -     CMP14+EGFR -     escitalopram  (LEXAPRO ) 20 MG tablet; Take 1 tablet (20 mg total) by mouth daily. -     TSH -     T4, Free  Mixed hyperlipidemia -     CBC with Differential/Platelet -     CMP14+EGFR -     Lipid panel      Lesions of  foot Plantar warts on the foot, likely viral in origin, causing hard, knotted lesions. Scrubbing may have contributed to spreading. No pain reported. - Referred to Dr. Roddie for evaluation and treatment, including possible freezing or cutting of warts. - Advised to stop scrubbing the warts to prevent spreading. - Recommended using [place] lotion nightly to help shed hard skin and prevent spreading.  Depression and generalized anxiety disorder Depression and generalized anxiety disorder, currently managed with Lexapro . Reports increased irritability and snapping, indicating possible under-treatment. No recent triggers identified, but stress from holidays and work may contribute. - Increased Lexapro  to 20 mg daily. - Instructed to take two of the current tablets until finished, then switch to 20 mg tablets. - Scheduled follow-up appointment around January for reassessment and lab work.          Continue all other maintenance medications.  Follow up plan: Return if symptoms worsen or fail to improve.   Continue healthy lifestyle choices, including diet (rich in fruits, vegetables, and lean proteins, and low in salt and simple carbohydrates) and exercise (at least 30 minutes of moderate physical activity daily).    The above assessment and management plan was discussed with the patient. The patient verbalized understanding of and has agreed to the management plan. Patient is aware to call the clinic if they develop any new symptoms or if symptoms persist or worsen. Patient is aware when to return to the clinic for a follow-up visit. Patient educated on when it is appropriate to go to the emergency department.   Rosaline Bruns, FNP-C [facility] [phone]

## 2024-03-07 ENCOUNTER — Ambulatory Visit: Admitting: Family Medicine

## 2024-03-13 ENCOUNTER — Encounter: Payer: Self-pay | Admitting: Family Medicine

## 2024-03-13 ENCOUNTER — Ambulatory Visit: Admitting: Family Medicine

## 2024-03-13 VITALS — BP 125/85 | HR 86 | Temp 97.5°F | Ht 62.0 in | Wt 184.2 lb

## 2024-03-13 DIAGNOSIS — F411 Generalized anxiety disorder: Secondary | ICD-10-CM | POA: Diagnosis not present

## 2024-03-13 DIAGNOSIS — E782 Mixed hyperlipidemia: Secondary | ICD-10-CM

## 2024-03-13 DIAGNOSIS — L989 Disorder of the skin and subcutaneous tissue, unspecified: Secondary | ICD-10-CM

## 2024-03-13 DIAGNOSIS — E66811 Obesity, class 1: Secondary | ICD-10-CM

## 2024-03-13 DIAGNOSIS — F331 Major depressive disorder, recurrent, moderate: Secondary | ICD-10-CM | POA: Diagnosis not present

## 2024-03-13 LAB — LIPID PANEL

## 2024-03-13 MED ORDER — ESCITALOPRAM OXALATE 20 MG PO TABS: 20.0000 mg | ORAL_TABLET | Freq: Every day | ORAL | 3 refills | Status: AC

## 2024-03-13 NOTE — Progress Notes (Signed)
 "    Subjective:  Patient ID: Tamara Mendoza, female    DOB: July 17, 1965, 59 y.o.   MRN: [id number]  Patient Care Team: Severa Rock HERO, FNP as PCP - General (Family Medicine) Cindie Carlin POUR, DO as Consulting Physician (Internal Medicine) Billee Mliss BIRCH, RPH-CPP as Pharmacist (Family Medicine)   Chief Complaint:  Depression and Anxiety (2 month follow up - states that it has improved )   HPI: Tamara Mendoza is a 59 y.o. female presenting on 03/13/2024 for Depression and Anxiety (2 month follow up - states that it has improved )    [name] is a 59 year old female who presents for a follow-up visit for anxiety and depression.  She feels better since her last visit at the end of November. She is currently taking Lexapro , initially prescribed at 10 mg, but she has been taking two tablets to reach a 20 mg dose. She has not experienced any side effects from the medication and feels that the increased dose is beneficial. No dizziness, weakness, or confusion.  She mentions persistent sleep issues, which she attributes to her variable work schedule, alternating between first and second shifts. She has not tried any sleep aids due to concerns about their impact on her work schedule.  Her recent lab work indicated slightly elevated cholesterol and ferritin levels, and her creatinine was a bit low.  She also mentions family stress related to her mother's care, as her mother's current doctor is leaving, and she is considering future care options.         03/13/2024   10:42 AM 01/04/2024   10:36 AM 08/22/2023    2:27 PM 08/16/2023   12:15 PM 04/20/2023   10:42 AM  Depression screen PHQ 2/9  Decreased Interest 2 2 2 2 2   Down, Depressed, Hopeless 0 2 0 1 2  PHQ - 2 Score 2 4 2 3 4   Altered sleeping 2 3 2 2 2   Tired, decreased energy 2 3 2 2 2   Change in appetite 2 2 2 2 2   Feeling bad or failure about yourself  0 0 0 0 2  Trouble concentrating 2 0 1 2 2   Moving slowly or  fidgety/restless 0 0 0 0 2  Suicidal thoughts 0 0 0 0 0  PHQ-9 Score 10 12 9  11  16    Difficult doing work/chores Not difficult at all Not difficult at all Not difficult at all Not difficult at all Not difficult at all     Data saved with a previous flowsheet row definition      03/13/2024   10:42 AM 01/04/2024   10:36 AM 08/22/2023    2:27 PM 04/20/2023   10:43 AM  GAD 7 : Generalized Anxiety Score  Nervous, Anxious, on Edge 0 0  1  2   Control/stop worrying 2 2  1  2    Worry too much - different things 2 2  1  2    Trouble relaxing 2 2  1  2    Restless 0 0  0  2   Easily annoyed or irritable 3 2  1  2    Afraid - awful might happen 2 2  1  1    Total GAD 7 Score 11 10 6 13   Anxiety Difficulty Not difficult at all Not difficult at all Not difficult at all Not difficult at all     Data saved with a previous flowsheet row definition  Relevant past medical, surgical, family, and social history reviewed and updated as indicated.  Allergies and medications reviewed and updated. Data reviewed: Chart in Epic.   Past Medical History:  Diagnosis Date   Anxiety and depression    Arthritis    Eczema    Mixed hyperlipidemia     Past Surgical History:  Procedure Laterality Date   BIOPSY  04/05/2020   Procedure: BIOPSY;  Surgeon: Cindie Carlin POUR, DO;  [facility];  Service: Endoscopy;;   BIOPSY  06/21/2020   Procedure: BIOPSY;  Surgeon: Wilhelmenia Aloha Raddle., MD;  Location: [facility];  Service: Gastroenterology;;   CESAREAN SECTION     COLONOSCOPY WITH PROPOFOL  N/A 04/05/2020   Procedure: COLONOSCOPY WITH PROPOFOL ;  Surgeon: Cindie Carlin POUR, DO;  [facility];  Service: Endoscopy;  Laterality: N/A;  11:00 ASA I/II, pt tested + 1/26   COLONOSCOPY WITH PROPOFOL  N/A 06/21/2020   Procedure: COLONOSCOPY WITH PROPOFOL ;  Surgeon: Mansouraty, Aloha Raddle., MD;  [facility]: [facility];  Service: Gastroenterology;  Laterality: N/A;   ENDOSCOPIC MUCOSAL RESECTION  N/A 06/21/2020   Procedure: ENDOSCOPIC MUCOSAL RESECTION;  Surgeon: Wilhelmenia Aloha Raddle., MD;  [facility];  Service: Gastroenterology;  Laterality: N/A;   HEMOSTASIS CLIP PLACEMENT  06/21/2020   Procedure: HEMOSTASIS CLIP PLACEMENT;  Surgeon: Wilhelmenia Aloha Raddle., MD;  Location: [facility];  Service: Gastroenterology;;   POLYPECTOMY  04/05/2020   Procedure: POLYPECTOMY;  Surgeon: Cindie Carlin POUR, DO;  [facility];  Service: Endoscopy;;   POLYPECTOMY  06/21/2020   Procedure: POLYPECTOMY;  Surgeon: Wilhelmenia Aloha Raddle., MD;  Location: [facility];  Service: Gastroenterology;;    Social History   Socioeconomic History   Marital status: Single    Spouse name: Not on file   Number of children: Not on file   Years of education: Not on file   Highest education level: Not on file  Occupational History   Occupation: [employer]  Tobacco Use   Smoking status: Every Day    Current packs/day: 0.25    Average packs/day: 0.3 packs/day for 30.0 years (7.5 ttl pk-yrs)    Types: Cigarettes   Smokeless tobacco: Never  Vaping Use   Vaping status: Never Used  Substance and Sexual Activity   Alcohol use: Yes    Comment: occ   Drug use: No   Sexual activity: Not on file  Other Topics Concern   Not on file  Social History Narrative   Not on file   Social Drivers of Health   Tobacco Use: High Risk (03/13/2024)   Patient History    Smoking Tobacco Use: Every Day    Smokeless Tobacco Use: Never    Passive Exposure: Not on file  Financial Resource Strain: Not on file  Food Insecurity: Not on file  Transportation Needs: Not on file  Physical Activity: Not on file  Stress: Not on file  Social Connections: Not on file  Intimate Partner Violence: Not At Risk (11/16/2021)   Received from [facility]   Interpersonal Violence    Patient afraid of, threatened, hurt, or sexually abused by someone known to him/her: No  Depression (PHQ2-9): Medium Risk  (03/13/2024)   Depression (PHQ2-9)    PHQ-2 Score: 10  Alcohol Screen: Not on file  Housing: Not on file  Utilities: Not on file  Health Literacy: Not on file    Outpatient Encounter Medications as of 03/13/2024  Medication Sig   escitalopram  (LEXAPRO ) 20 MG tablet Take 1 tablet (20  mg total) by mouth daily.   [DISCONTINUED] escitalopram  (LEXAPRO ) 20 MG tablet Take 1 tablet (20 mg total) by mouth daily.   No facility-administered encounter medications on file as of 03/13/2024.    Allergies[1]  Pertinent ROS per HPI, otherwise unremarkable      Objective:  BP 125/85   Pulse 86   Temp (!) 97.5 F (36.4 C)   Ht 5' 2 (1.575 m)   Wt 184 lb 3.2 oz (83.6 kg)   SpO2 94%   BMI 33.69 kg/m    Wt Readings from Last 3 Encounters:  03/13/24 184 lb 3.2 oz (83.6 kg)  01/04/24 182 lb (82.6 kg)  08/22/23 185 lb 9.6 oz (84.2 kg)    Physical Exam Vitals and nursing note reviewed.  Constitutional:      General: She is not in acute distress.    Appearance: Normal appearance. She is obese. She is not ill-appearing, toxic-appearing or diaphoretic.  HENT:     Head: Normocephalic and atraumatic.     Nose: Nose normal.     Mouth/Throat:     Mouth: Mucous membranes are moist.  Eyes:     Pupils: Pupils are equal, round, and reactive to light.  Cardiovascular:     Rate and Rhythm: Normal rate and regular rhythm.     Heart sounds: Normal heart sounds.  Musculoskeletal:     Cervical back: Neck supple.  Skin:    General: Skin is warm and dry.     Capillary Refill: Capillary refill takes less than 2 seconds.  Neurological:     General: No focal deficit present.     Mental Status: She is alert and oriented to person, place, and time.  Psychiatric:        Mood and Affect: Mood normal.        Behavior: Behavior normal.        Thought Content: Thought content normal.        Judgment: Judgment normal.      Results for orders placed or performed in visit on 08/16/23  Microscopic  Examination   Collection Time: 08/16/23 12:39 PM   Urine  Result Value Ref Range   WBC, UA 0-5 0 - 5 /hpf   RBC, Urine 0-2 0 - 2 /hpf   Epithelial Cells (non renal) 0-10 0 - 10 /hpf   Renal Epithel, UA None seen None seen /hpf   Mucus, UA Present (A) Not Estab.   Bacteria, UA Few (A) None seen/Few   Yeast, UA None seen None seen  Urinalysis, Complete   Collection Time: 08/16/23 12:39 PM  Result Value Ref Range   Specific Gravity, UA 1.025 1.005 - 1.030   pH, UA 6.0 5.0 - 7.5   Color, UA Yellow Yellow   Appearance Ur Clear Clear   Leukocytes,UA Negative Negative   Protein,UA 2+ (A) Negative/Trace   Glucose, UA Negative Negative   Ketones, UA 1+ (A) Negative   RBC, UA Trace (A) Negative   Bilirubin, UA Negative Negative   Urobilinogen, Ur 2.0 (H) 0.2 - 1.0 mg/dL   Nitrite, UA Negative Negative   Microscopic Examination See below:        Pertinent labs & imaging results that were available during my care of the patient were reviewed by me and considered in my medical decision making.  Assessment & Plan:  Cydnee Fuquay was seen today for depression and anxiety.  Diagnoses and all orders for this visit:  Recurrent major depressive episodes, moderate (HCC) -  escitalopram  (LEXAPRO ) 20 MG tablet; Take 1 tablet (20 mg total) by mouth daily. -     CMP14+EGFR -     CBC with Differential/Platelet -     TSH -     VITAMIN D 25 Hydroxy (Vit-D Deficiency, Fractures) -     T4, Free  GAD (generalized anxiety disorder) -     escitalopram  (LEXAPRO ) 20 MG tablet; Take 1 tablet (20 mg total) by mouth daily. -     CMP14+EGFR -     CBC with Differential/Platelet -     TSH -     VITAMIN D 25 Hydroxy (Vit-D Deficiency, Fractures) -     T4, Free  Mixed hyperlipidemia -     CMP14+EGFR -     Lipid panel -     TSH -     T4, Free  Obesity (BMI 30.0-34.9) -     CMP14+EGFR -     CBC with Differential/Platelet -     Lipid panel -     TSH -     VITAMIN D 25 Hydroxy (Vit-D Deficiency,  Fractures) -     T4, Free        Major depressive disorder, recurrent Reports improvement in symptoms since increasing Lexapro  to 20 mg daily. No side effects reported. Tolerating current dose well. - Sent prescription for Lexapro  20 mg. - Instructed to check medication bottle to confirm dose and report back. - If taking two 20 mg tablets, will increase dose to 40 mg daily.  Generalized anxiety disorder Reports improvement in anxiety symptoms with current Lexapro  regimen. No side effects such as dizziness, weakness, or confusion reported. Sodium levels were normal after dose increase in January. - Continue current Lexapro  regimen.  Mixed hyperlipidemia Cholesterol levels were slightly elevated in previous labs. - Ordered repeat lab work to assess cholesterol levels.          Continue all other maintenance medications.  Follow up plan: Return in about 3 months (around 06/11/2024), or if symptoms worsen or fail to improve, for chronic follow up.   Continue healthy lifestyle choices, including diet (rich in fruits, vegetables, and lean proteins, and low in salt and simple carbohydrates) and exercise (at least 30 minutes of moderate physical activity daily).  Educational handout given for Depression order  The above assessment and management plan was discussed with the patient. The patient verbalized understanding of and has agreed to the management plan. Patient is aware to call the clinic if they develop any new symptoms or if symptoms persist or worsen. Patient is aware when to return to the clinic for a follow-up visit. Patient educated on when it is appropriate to go to the emergency department.   Rosaline Bruns, FNP-C [facility] [phone]     [1] No Known Allergies  "

## 2024-03-14 ENCOUNTER — Ambulatory Visit: Payer: Self-pay | Admitting: Family Medicine

## 2024-03-14 LAB — CMP14+EGFR
ALT: 19 [IU]/L (ref 0–32)
AST: 20 [IU]/L (ref 0–40)
Albumin: 4 g/dL (ref 3.8–4.9)
Alkaline Phosphatase: 92 [IU]/L (ref 49–135)
BUN/Creatinine Ratio: 24 — ABNORMAL HIGH (ref 9–23)
BUN: 14 mg/dL (ref 6–24)
Bilirubin Total: 0.5 mg/dL (ref 0.0–1.2)
CO2: 22 mmol/L (ref 20–29)
Calcium: 8.8 mg/dL (ref 8.7–10.2)
Chloride: 103 mmol/L (ref 96–106)
Creatinine, Ser: 0.59 mg/dL (ref 0.57–1.00)
Globulin, Total: 2.3 g/dL (ref 1.5–4.5)
Glucose: 86 mg/dL (ref 70–99)
Potassium: 4.5 mmol/L (ref 3.5–5.2)
Sodium: 138 mmol/L (ref 134–144)
Total Protein: 6.3 g/dL (ref 6.0–8.5)
eGFR: 104 mL/min/{1.73_m2}

## 2024-03-14 LAB — CBC WITH DIFFERENTIAL/PLATELET
Basophils Absolute: 0.1 10*3/uL (ref 0.0–0.2)
Basos: 1 %
EOS (ABSOLUTE): 0.1 10*3/uL (ref 0.0–0.4)
Eos: 2 %
Hematocrit: 44.1 % (ref 34.0–46.6)
Hemoglobin: 13.9 g/dL (ref 11.1–15.9)
Immature Grans (Abs): 0 10*3/uL (ref 0.0–0.1)
Immature Granulocytes: 0 %
Lymphocytes Absolute: 2.1 10*3/uL (ref 0.7–3.1)
Lymphs: 32 %
MCH: 27.6 pg (ref 26.6–33.0)
MCHC: 31.5 g/dL (ref 31.5–35.7)
MCV: 88 fL (ref 79–97)
Monocytes Absolute: 0.7 10*3/uL (ref 0.1–0.9)
Monocytes: 11 %
Neutrophils Absolute: 3.6 10*3/uL (ref 1.4–7.0)
Neutrophils: 54 %
Platelets: 262 10*3/uL (ref 150–450)
RBC: 5.04 x10E6/uL (ref 3.77–5.28)
RDW: 12.8 % (ref 11.7–15.4)
WBC: 6.6 10*3/uL (ref 3.4–10.8)

## 2024-03-14 LAB — T4, FREE: Free T4: 1.3 ng/dL (ref 0.82–1.77)

## 2024-03-14 LAB — LIPID PANEL
Chol/HDL Ratio: 5 ratio — ABNORMAL HIGH (ref 0.0–4.4)
Cholesterol, Total: 211 mg/dL — ABNORMAL HIGH (ref 100–199)
HDL: 42 mg/dL
LDL Chol Calc (NIH): 142 mg/dL — ABNORMAL HIGH (ref 0–99)
Triglycerides: 149 mg/dL (ref 0–149)
VLDL Cholesterol Cal: 27 mg/dL (ref 5–40)

## 2024-03-14 LAB — VITAMIN D 25 HYDROXY (VIT D DEFICIENCY, FRACTURES): Vit D, 25-Hydroxy: 25.8 ng/mL — ABNORMAL LOW (ref 30.0–100.0)

## 2024-03-14 LAB — TSH: TSH: 1.43 u[IU]/mL (ref 0.450–4.500)

## 2024-03-14 MED ORDER — ATORVASTATIN CALCIUM 20 MG PO TABS: 20.0000 mg | ORAL_TABLET | Freq: Every day | ORAL | 1 refills | Status: AC

## 2024-03-14 NOTE — Telephone Encounter (Signed)
 Patient aware and verbalizes understanding.  Patient agreed to rx and rx sent

## 2024-06-12 ENCOUNTER — Ambulatory Visit: Admitting: Family Medicine
